# Patient Record
Sex: Female | Born: 1985 | Race: White | Hispanic: No | Marital: Married | Smoking: Never smoker
Health system: Southern US, Community
[De-identification: ages and names within clinical notes are randomized; demographics above are authoritative.]

## PROBLEM LIST (undated history)

## (undated) DIAGNOSIS — T7840XA Allergy, unspecified, initial encounter: Secondary | ICD-10-CM

## (undated) DIAGNOSIS — Z789 Other specified health status: Secondary | ICD-10-CM

## (undated) DIAGNOSIS — U071 COVID-19: Secondary | ICD-10-CM

## (undated) HISTORY — DX: Other specified health status: Z78.9

## (undated) HISTORY — DX: Allergy, unspecified, initial encounter: T78.40XA

## (undated) HISTORY — PX: WISDOM TOOTH EXTRACTION: SHX21

## (undated) HISTORY — DX: COVID-19: U07.1

---

## 2017-06-25 DIAGNOSIS — Z3490 Encounter for supervision of normal pregnancy, unspecified, unspecified trimester: Secondary | ICD-10-CM | POA: Insufficient documentation

## 2017-06-26 LAB — OB RESULTS CONSOLE ABO/RH: RH TYPE: POSITIVE

## 2017-06-26 LAB — OB RESULTS CONSOLE GC/CHLAMYDIA
CHLAMYDIA, DNA PROBE: NEGATIVE
Gonorrhea: NEGATIVE

## 2017-06-26 LAB — OB RESULTS CONSOLE RPR: RPR: NONREACTIVE

## 2017-06-26 LAB — OB RESULTS CONSOLE RUBELLA ANTIBODY, IGM: RUBELLA: IMMUNE

## 2017-06-26 LAB — OB RESULTS CONSOLE HEPATITIS B SURFACE ANTIGEN: HEP B S AG: NEGATIVE

## 2017-06-26 LAB — OB RESULTS CONSOLE ANTIBODY SCREEN: Antibody Screen: NEGATIVE

## 2017-06-26 LAB — OB RESULTS CONSOLE HIV ANTIBODY (ROUTINE TESTING): HIV: NONREACTIVE

## 2017-11-25 NOTE — L&D Delivery Note (Signed)
Delivery Note Pt progressed quickly to complete with uncontrollable urge to push. She pushed in presence of nurse (then midwife then Dr Jackelyn KnifeMeisinger. At 7:50 AM a viable female was delivered via Vaginal, Spontaneous (Presentation:LOA ;  ).  APGAR: 8, 9; weight  pending.   Placenta status:delivered , .  Cord:3vc  with the following complications: nuchal x1 Cord pH: n/a  Anesthesia:  None Episiotomy: None Lacerations: None Est. Blood Loss (mL): 100  Mom to postpartum.  Baby to Couplet care / Skin to Skin  Circ in office.  Cathrine MusterCecilia W Nike Southwell 01/21/2018, 8:22 AM

## 2017-12-29 LAB — OB RESULTS CONSOLE GBS: GBS: NEGATIVE

## 2018-01-14 ENCOUNTER — Encounter (HOSPITAL_COMMUNITY): Payer: Self-pay | Admitting: *Deleted

## 2018-01-14 ENCOUNTER — Telehealth (HOSPITAL_COMMUNITY): Payer: Self-pay | Admitting: *Deleted

## 2018-01-14 NOTE — Telephone Encounter (Signed)
Preadmission screen  

## 2018-01-21 ENCOUNTER — Inpatient Hospital Stay (HOSPITAL_COMMUNITY)
Admission: AD | Admit: 2018-01-21 | Discharge: 2018-01-23 | DRG: 807 | Disposition: A | Discharge status: Home / Self Care | Payer: No Typology Code available for payment source | Source: Ambulatory Visit | Attending: Obstetrics and Gynecology | Admitting: Obstetrics and Gynecology

## 2018-01-21 ENCOUNTER — Encounter (HOSPITAL_COMMUNITY): Payer: Self-pay | Admitting: *Deleted

## 2018-01-21 ENCOUNTER — Inpatient Hospital Stay (HOSPITAL_COMMUNITY): Payer: No Typology Code available for payment source | Admitting: Anesthesiology

## 2018-01-21 ENCOUNTER — Other Ambulatory Visit: Payer: Self-pay

## 2018-01-21 DIAGNOSIS — O4103X0 Oligohydramnios, third trimester, not applicable or unspecified: Principal | ICD-10-CM | POA: Diagnosis present

## 2018-01-21 DIAGNOSIS — Z3A39 39 weeks gestation of pregnancy: Secondary | ICD-10-CM | POA: Diagnosis not present

## 2018-01-21 LAB — CBC
HEMATOCRIT: 36.6 % (ref 36.0–46.0)
Hemoglobin: 13 g/dL (ref 12.0–15.0)
MCH: 33.6 pg (ref 26.0–34.0)
MCHC: 35.5 g/dL (ref 30.0–36.0)
MCV: 94.6 fL (ref 78.0–100.0)
PLATELETS: 226 10*3/uL (ref 150–400)
RBC: 3.87 MIL/uL (ref 3.87–5.11)
RDW: 13.1 % (ref 11.5–15.5)
WBC: 13.1 10*3/uL — ABNORMAL HIGH (ref 4.0–10.5)

## 2018-01-21 LAB — ABO/RH: ABO/RH(D): O POS

## 2018-01-21 LAB — RPR: RPR: NONREACTIVE

## 2018-01-21 LAB — TYPE AND SCREEN
ABO/RH(D): O POS
Antibody Screen: NEGATIVE

## 2018-01-21 MED ORDER — LACTATED RINGERS IV SOLN: 500.0000 mL | INTRAVENOUS | Status: DC | PRN

## 2018-01-21 MED ORDER — LIDOCAINE HCL (PF) 1 % IJ SOLN
30.0000 mL | INTRAMUSCULAR | Status: DC | PRN
  Filled 2018-01-21: qty 30

## 2018-01-21 MED ORDER — IBUPROFEN 100 MG/5ML PO SUSP
600.0000 mg | Freq: Four times a day (QID) | ORAL | Status: DC
  Administered 2018-01-21 – 2018-01-23 (×4): 600 mg via ORAL
  Filled 2018-01-21 (×8): qty 30

## 2018-01-21 MED ORDER — PRENATAL MULTIVITAMIN CH
1.0000 | ORAL_TABLET | Freq: Every day | ORAL | Status: DC
  Administered 2018-01-21: 1 via ORAL
  Filled 2018-01-21: qty 1

## 2018-01-21 MED ORDER — LACTATED RINGERS IV SOLN: 500.0000 mL | Freq: Once | INTRAVENOUS | Status: DC

## 2018-01-21 MED ORDER — IBUPROFEN 600 MG PO TABS
600.0000 mg | ORAL_TABLET | Freq: Four times a day (QID) | ORAL | Status: DC
  Administered 2018-01-21: 600 mg via ORAL
  Filled 2018-01-21: qty 1

## 2018-01-21 MED ORDER — SENNOSIDES-DOCUSATE SODIUM 8.6-50 MG PO TABS
2.0000 | ORAL_TABLET | ORAL | Status: DC
  Administered 2018-01-21 – 2018-01-23 (×2): 2 via ORAL
  Filled 2018-01-21 (×2): qty 2

## 2018-01-21 MED ORDER — ONDANSETRON HCL 4 MG/2ML IJ SOLN: 4.0000 mg | Freq: Four times a day (QID) | INTRAMUSCULAR | Status: DC | PRN

## 2018-01-21 MED ORDER — EPHEDRINE 5 MG/ML INJ
10.0000 mg | INTRAVENOUS | Status: DC | PRN
  Filled 2018-01-21: qty 2

## 2018-01-21 MED ORDER — ZOLPIDEM TARTRATE 5 MG PO TABS: 5.0000 mg | ORAL_TABLET | Freq: Every evening | ORAL | Status: DC | PRN

## 2018-01-21 MED ORDER — DIPHENHYDRAMINE HCL 50 MG/ML IJ SOLN: 12.5000 mg | INTRAMUSCULAR | Status: DC | PRN

## 2018-01-21 MED ORDER — PHENYLEPHRINE 40 MCG/ML (10ML) SYRINGE FOR IV PUSH (FOR BLOOD PRESSURE SUPPORT)
80.0000 ug | PREFILLED_SYRINGE | INTRAVENOUS | Status: DC | PRN
  Filled 2018-01-21: qty 5
  Filled 2018-01-21: qty 10

## 2018-01-21 MED ORDER — BENZOCAINE-MENTHOL 20-0.5 % EX AERO
1.0000 "application " | INHALATION_SPRAY | CUTANEOUS | Status: DC | PRN
  Administered 2018-01-21: 1 via TOPICAL
  Filled 2018-01-21: qty 56

## 2018-01-21 MED ORDER — SOD CITRATE-CITRIC ACID 500-334 MG/5ML PO SOLN: 30.0000 mL | ORAL | Status: DC | PRN

## 2018-01-21 MED ORDER — PHENYLEPHRINE 40 MCG/ML (10ML) SYRINGE FOR IV PUSH (FOR BLOOD PRESSURE SUPPORT)
80.0000 ug | PREFILLED_SYRINGE | INTRAVENOUS | Status: DC | PRN
  Filled 2018-01-21: qty 10
  Filled 2018-01-21: qty 5

## 2018-01-21 MED ORDER — OXYCODONE-ACETAMINOPHEN 5-325 MG PO TABS: 2.0000 | ORAL_TABLET | ORAL | Status: DC | PRN

## 2018-01-21 MED ORDER — COMPLETENATE 29-1 MG PO CHEW
1.0000 | CHEWABLE_TABLET | Freq: Every day | ORAL | Status: DC
  Administered 2018-01-22 – 2018-01-23 (×2): 1 via ORAL
  Filled 2018-01-21 (×2): qty 1

## 2018-01-21 MED ORDER — PSEUDOEPHEDRINE HCL 30 MG PO TABS: 30.0000 mg | ORAL_TABLET | Freq: Four times a day (QID) | ORAL | Status: DC | PRN

## 2018-01-21 MED ORDER — WITCH HAZEL-GLYCERIN EX PADS: 1.0000 "application " | MEDICATED_PAD | CUTANEOUS | Status: DC | PRN

## 2018-01-21 MED ORDER — DIBUCAINE 1 % RE OINT: 1.0000 "application " | TOPICAL_OINTMENT | RECTAL | Status: DC | PRN

## 2018-01-21 MED ORDER — OXYCODONE HCL 5 MG PO TABS: 10.0000 mg | ORAL_TABLET | ORAL | Status: DC | PRN

## 2018-01-21 MED ORDER — OXYCODONE-ACETAMINOPHEN 5-325 MG PO TABS: 1.0000 | ORAL_TABLET | ORAL | Status: DC | PRN

## 2018-01-21 MED ORDER — ACETAMINOPHEN 325 MG PO TABS: 650.0000 mg | ORAL_TABLET | ORAL | Status: DC | PRN

## 2018-01-21 MED ORDER — ACETAMINOPHEN 325 MG PO TABS
650.0000 mg | ORAL_TABLET | ORAL | Status: DC | PRN
  Filled 2018-01-21: qty 2

## 2018-01-21 MED ORDER — OXYCODONE HCL 5 MG PO TABS: 5.0000 mg | ORAL_TABLET | ORAL | Status: DC | PRN

## 2018-01-21 MED ORDER — DIPHENHYDRAMINE HCL 25 MG PO CAPS: 25.0000 mg | ORAL_CAPSULE | Freq: Four times a day (QID) | ORAL | Status: DC | PRN

## 2018-01-21 MED ORDER — BUTORPHANOL TARTRATE 1 MG/ML IJ SOLN: 1.0000 mg | INTRAMUSCULAR | Status: DC | PRN

## 2018-01-21 MED ORDER — LACTATED RINGERS IV SOLN: INTRAVENOUS | Status: DC

## 2018-01-21 MED ORDER — OXYTOCIN BOLUS FROM INFUSION
500.0000 mL | Freq: Once | INTRAVENOUS | Status: AC
  Administered 2018-01-21: 500 mL via INTRAVENOUS

## 2018-01-21 MED ORDER — COCONUT OIL OIL
1.0000 "application " | TOPICAL_OIL | Status: DC | PRN
  Administered 2018-01-23: 1 via TOPICAL
  Filled 2018-01-21: qty 120

## 2018-01-21 MED ORDER — SIMETHICONE 80 MG PO CHEW
80.0000 mg | CHEWABLE_TABLET | ORAL | Status: DC | PRN
Start: 2018-01-21 — End: 2018-01-23

## 2018-01-21 MED ORDER — OXYTOCIN 40 UNITS IN LACTATED RINGERS INFUSION - SIMPLE MED
2.5000 [IU]/h | INTRAVENOUS | Status: DC
  Filled 2018-01-21: qty 1000

## 2018-01-21 MED ORDER — TETANUS-DIPHTH-ACELL PERTUSSIS 5-2.5-18.5 LF-MCG/0.5 IM SUSP: 0.5000 mL | Freq: Once | INTRAMUSCULAR | Status: DC

## 2018-01-21 MED ORDER — ONDANSETRON HCL 4 MG/2ML IJ SOLN: 4.0000 mg | INTRAMUSCULAR | Status: DC | PRN

## 2018-01-21 MED ORDER — ONDANSETRON HCL 4 MG PO TABS: 4.0000 mg | ORAL_TABLET | ORAL | Status: DC | PRN

## 2018-01-21 MED ORDER — FENTANYL 2.5 MCG/ML BUPIVACAINE 1/10 % EPIDURAL INFUSION (WH - ANES)
14.0000 mL/h | INTRAMUSCULAR | Status: DC | PRN
  Filled 2018-01-21: qty 100

## 2018-01-21 MED ORDER — FLEET ENEMA 7-19 GM/118ML RE ENEM: 1.0000 | ENEMA | Freq: Every day | RECTAL | Status: DC | PRN

## 2018-01-21 NOTE — Lactation Note (Signed)
This note was copied from a baby's chart. Lactation Consultation Note  Patient Name: Jeanette Rice ZOXWR'UToday's Date: 01/21/2018 Reason for consult: Initial assessment;Term This is second baby and newborn is 5 hours old.  Mom had difficult latch with first and used a nipple shield.  Baby had excessive weight loss the first week and needed formula supplementation.  Newborn has not fed yet.  He has started to show feeding cues.  Assisted with positioning baby skin to skin in cross cradle hold on right breast. Hand expression done with a glisten of colostrum noted.  After a few minutes baby latched well with weak sucks for 7 minutes.  Waking techniques and breast massage done.  Baby pulled off and would not relatch.  Attempted opposite breast but he was showing no interest.  Mom feeling strong uterine cramping during feeding.  Explained why and that this is also a sign of milk letdown.  Instructed to watch for feeding cues and call for assist/concerns prn.  Maternal Data Has patient been taught Hand Expression?: Yes Does the patient have breastfeeding experience prior to this delivery?: Yes  Feeding Feeding Type: Breast Fed Length of feed: 7 min  LATCH Score Latch: Repeated attempts needed to sustain latch, nipple held in mouth throughout feeding, stimulation needed to elicit sucking reflex.  Audible Swallowing: A few with stimulation  Type of Nipple: Everted at rest and after stimulation  Comfort (Breast/Nipple): Soft / non-tender  Hold (Positioning): Assistance needed to correctly position infant at breast and maintain latch.  LATCH Score: 7  Interventions Interventions: Breast feeding basics reviewed;Assisted with latch;Skin to skin;Breast massage;Hand express;Breast compression;Adjust position;Support pillows;Position options  Lactation Tools Discussed/Used     Consult Status Consult Status: Follow-up Date: 01/22/18 Follow-up type: In-patient    Huston FoleyMOULDEN, Abdou Stocks S 01/21/2018,  12:55 PM

## 2018-01-21 NOTE — Anesthesia Pain Management Evaluation Note (Signed)
  CRNA Pain Management Visit Note  Patient: Jeanette Rice, 32 y.o., female  "Hello I am a member of the anesthesia team at [facility]. We have an anesthesia team available at all times to provide care throughout the hospital, including epidural management and anesthesia for C-section. I don't know your plan for the delivery whether it a natural birth, water birth, IV sedation, nitrous supplementation, doula or epidural, but we want to meet your pain goals."   1.Was your pain managed to your expectations on prior hospitalizations?   No prior hospitalizations  2.What is your expectation for pain management during this hospitalization?     Epidural  3.How can we help you reach that goal?   Record the patient's initial score and the patient's pain goal.   Pain: 10  Pain Goal: 3 [facility] wants you to be able to say your pain was always managed very well.  Jeanette Rice,Davidson Palmieri Hristova 01/21/2018

## 2018-01-21 NOTE — Anesthesia Preprocedure Evaluation (Deleted)
Anesthesia Evaluation    Airway        Dental   Pulmonary           Cardiovascular      Neuro/Psych    GI/Hepatic   Endo/Other    Renal/GU      Musculoskeletal   Abdominal   Peds  Hematology   Anesthesia Other Findings   Reproductive/Obstetrics                            Lab Results  Component Value Date   WBC 13.1 (H) 01/21/2018   HGB 13.0 01/21/2018   HCT 36.6 01/21/2018   MCV 94.6 01/21/2018   PLT 226 01/21/2018    Anesthesia Physical Anesthesia Plan  ASA:   Anesthesia Plan:    Post-op Pain Management:    Induction:   PONV Risk Score and Plan:   Airway Management Planned:   Additional Equipment:   Intra-op Plan:   Post-operative Plan:   Informed Consent:   Plan Discussed with:   Anesthesia Plan Comments:        Anesthesia Quick Evaluation

## 2018-01-21 NOTE — H&P (Signed)
Jeanette Rice is a 32 y.o. female presenting for painful regular contractions. Pt arrived at [facility] and was 3cm and progressed to complete within two hours of arrival. . Nl panorama screen. Her prenatal course was benign till this past week when had ?oligohydramnios.  OB History    Gravida Para Term Preterm AB Living   2 1 1     1    SAB TAB Ectopic Multiple Live Births           1     Past Medical History:  Diagnosis Date  . Medical history non-contributory    Past Surgical History:  Procedure Laterality Date  . WISDOM TOOTH EXTRACTION     Family History: family history is not on file. Social History:  reports that  has never smoked. she has never used smokeless tobacco. She reports that she does not drink alcohol or use drugs.     Maternal Diabetes: No Genetic Screening: Normal Maternal Ultrasounds/Referrals: Normal ( ? [place]) Fetal Ultrasounds or other Referrals:  None Maternal Substance Abuse:  No Significant Maternal Medications:  None Significant Maternal Lab Results:  None Other Comments:  None  Review of Systems  Constitutional: Negative for chills, fever, malaise/fatigue and weight loss.  Eyes: Negative for blurred vision.  Respiratory: Negative for shortness of breath.   Cardiovascular: Negative for chest pain.  Gastrointestinal: Positive for abdominal pain. Negative for heartburn, nausea and vomiting.  Genitourinary: Negative for dysuria.  Musculoskeletal: Negative for back pain.  Skin: Negative for itching and rash.  Neurological: Negative for dizziness and headaches.  Endo/Heme/Allergies: Does not bruise/bleed easily.  Psychiatric/Behavioral: Negative for depression, hallucinations, substance abuse and suicidal ideas. The patient is not nervous/anxious.    Maternal Medical History:  Reason for admission: Contractions.  Nausea.  Contractions: Onset was 6-12 hours ago.   Frequency: regular.   Perceived severity is strong.    Fetal activity: Perceived fetal  activity is normal.   Last perceived fetal movement was within the past hour.    Prenatal complications: Oligohydramnios.   Prenatal Complications - Diabetes: none.    Dilation: 5.5 Effacement (%): 90 Station: -1 Exam by:: Gifford ShaveYancey Luft RN  Blood pressure 117/71, pulse 85, temperature 98 F (36.7 C), resp. rate 18, height 5\' 5"  (1.651 m), weight 162 lb (73.5 kg), last menstrual period 04/20/2017, SpO2 100 %. Maternal Exam:  Uterine Assessment: Contraction strength is firm.  Contraction frequency is rare.   Abdomen: Patient reports generalized tenderness.  Pelvis: adequate for delivery.   Cervix: Cervix evaluated by digital exam.     Physical Exam  Constitutional: She appears well-developed and well-nourished.  Neck: Normal range of motion.  Cardiovascular: Normal rate.  GI: Soft. There is generalized tenderness.  Genitourinary: Vagina normal and uterus normal.  Skin: Skin is warm and dry.  Psychiatric: She has a normal mood and affect. Her behavior is normal. Judgment and thought content normal.    Prenatal labs: ABO, Rh: --/--/O POS (02/27 [id number]) Antibody: PENDING (02/27 0556) Rubella: Immune (08/02 0000) RPR: Nonreactive (08/02 0000)  HBsAg: Negative (08/02 0000)  HIV: Non-reactive (08/02 0000)  GBS: Negative (02/04 0000)   Assessment/Plan: Prime in labor  Admit    Jeanette Rice 01/21/2018, 8:15 AM

## 2018-01-21 NOTE — [facility]U Note (Signed)
Cntrx 5 mins since 0200. Denies LOF or bleeding. +FM.

## 2018-01-22 ENCOUNTER — Inpatient Hospital Stay (HOSPITAL_COMMUNITY): Payer: 59

## 2018-01-22 LAB — CBC
HCT: 38 % (ref 36.0–46.0)
Hemoglobin: 12.9 g/dL (ref 12.0–15.0)
MCH: 32.7 pg (ref 26.0–34.0)
MCHC: 33.9 g/dL (ref 30.0–36.0)
MCV: 96.4 fL (ref 78.0–100.0)
PLATELETS: 209 10*3/uL (ref 150–400)
RBC: 3.94 MIL/uL (ref 3.87–5.11)
RDW: 13.3 % (ref 11.5–15.5)
WBC: 11.1 10*3/uL — AB (ref 4.0–10.5)

## 2018-01-22 MED ORDER — GUAIFENESIN 100 MG/5ML PO SOLN
5.0000 mL | ORAL | Status: DC | PRN
  Administered 2018-01-22: 100 mg via ORAL
  Filled 2018-01-22: qty 15

## 2018-01-22 NOTE — Progress Notes (Signed)
CSW received consult for score of 9 on Edinburgh Postnatal Depression Scale.  A score of 10 or higher and or a positive response to question 10 is an automatic CSW consult.  Therefore, CSW is screening out referral at this time.  Please contact CSW by MOB's request or if current concerns arise.

## 2018-01-22 NOTE — Progress Notes (Addendum)
Post Partum Day 1 Subjective: up ad lib and tolerating PO  C/o worsening cough, no fever.  Slightly productive.  Objective: Blood pressure 106/61, pulse 77, temperature 97.7 F (36.5 C), temperature source Oral, resp. rate 18, height 5\' 5"  (1.651 m), weight 162 lb (73.5 kg), last menstrual period 04/20/2017, SpO2 100 %, unknown if currently breastfeeding.  Physical Exam:  General: alert and cooperative Lochia: appropriate Uterine Fundus: firm Lungs CTA bilaterally  Recent Labs    01/21/18 0601 01/22/18 0512  HGB 13.0 12.9  HCT 36.6 38.0    Assessment/Plan: Plan for discharge tomorrow  Plans circumcision at home with ceremony Lungs clear, will follow symptoms and treat  cough   LOS: 1 day   Oliver PilaKathy W Geovanni Rahming 01/22/2018, 10:32 AM

## 2018-01-23 MED ORDER — IBUPROFEN 100 MG/5ML PO SUSP: 600.0000 mg | Freq: Four times a day (QID) | ORAL | 1 refills | Status: DC | PRN

## 2018-01-23 NOTE — Lactation Note (Addendum)
This note was copied from a baby's chart. Lactation Consultation Note  Patient Name: Jeanette Virgie DadSarah Bais VHQIO'NToday's Date: 01/23/2018    Mother is wearing comfort gels and states nipples feel better. Discussed supply and demand.  Mother inquiring about sleeping during the night and only breastfeeding during the day.   Mom encouraged to feed baby 8-12 times/24 hours and with feeding cues.  Reviewed engorgement care and monitoring voids/stools. Pacifier use not recommended at this time.   Observed feeding. Mother complaining of painful latch. No cracks or abrasions.  Nipples round when unlatched. Provided comfort gels and shells. Reviewed engorgement care and monitoring voids/stools.     Maternal Data    Feeding Feeding Type: Breast Fed Length of feed: 10 min  LATCH Score                   Interventions    Lactation Tools Discussed/Used     Consult Status      Hardie PulleyBerkelhammer, Ruth Boschen 01/23/2018, 9:41 AM

## 2018-01-23 NOTE — Discharge Summary (Signed)
    OB Discharge Summary     Patient Name: Jeanette Rice DOB: [birth year] MRN: [id number]  Date of admission: 01/21/2018 Delivering MD: Shawna ClampBOOKER, KIMBERLY R   Date of discharge: 01/23/2018  Admitting diagnosis: 39 WEEKS CTX Intrauterine pregnancy: 7664w3d     Secondary diagnosis:  Active Problems:   Normal labor   Postpartum care following vaginal delivery      Discharge diagnosis: Term Pregnancy Delivered                                  Hospital course:  Onset of Labor With Vaginal Delivery     [age] y.o. yo [id number] at 1064w3d was admitted in Active Labor on 01/21/2018. Patient had an uncomplicated labor course as follows:  Membrane Rupture Time/Date: 7:12 AM ,01/21/2018   Intrapartum Procedures: Episiotomy: None [1]                                         Lacerations:  None [1]  She had rapid labor after SROM and had a precipitous delivery of a Viable infant. 01/21/2018  Information for the patient's newborn:  Corrie DandyGlazer, Boy Aisha [[id number]][[id number]]  Delivery Method: Vaginal, Spontaneous(Filed from Delivery Summary)    Pateint had an uncomplicated postpartum course.  She is ambulating, tolerating a regular diet, passing flatus, and urinating well. Patient is discharged home in stable condition on 01/23/18.   Physical exam  Vitals:   01/21/18 1430 01/22/18 0500 01/22/18 1815 01/23/18 0500  BP: 114/64 106/61 116/64 113/66  Pulse: 72 77 90 71  Resp: 20 18 17    Temp: 97.9 F (36.6 C) 97.7 F (36.5 C) 98.3 F (36.8 C) 98.2 F (36.8 C)  TempSrc: Oral Oral Oral Oral  SpO2:      Weight:      Height:       General: alert Lochia: appropriate Uterine Fundus: firm  Labs: Lab Results  Component Value Date   WBC 11.1 (H) 01/22/2018   HGB 12.9 01/22/2018   HCT 38.0 01/22/2018   MCV 96.4 01/22/2018   PLT 209 01/22/2018   No flowsheet data found.  Discharge instruction: per After Visit Summary and "Baby and Me Booklet".  After visit meds:  Allergies as of 01/23/2018   No Known Allergies     Medication List    TAKE these medications   ibuprofen 100 MG/5ML suspension Commonly known as:  ADVIL,MOTRIN Take 30 mLs (600 mg total) by mouth every 6 (six) hours as needed for moderate pain.       Diet: routine diet  Activity: Advance as tolerated. Pelvic rest for 6 weeks.   Outpatient follow up:6 weeks   Newborn Data: Live born female  Birth Weight: 6 lb 5.4 oz (2875 g) APGAR: 8, 9  Newborn Delivery   Birth date/time:  01/21/2018 07:50:00 Delivery type:  Vaginal, Spontaneous     Baby Feeding: Breast Disposition:home with mother   01/23/2018 Zenaida Nieceodd D Mehtaab Mayeda, MD

## 2018-01-23 NOTE — Lactation Note (Signed)
This note was copied from a baby's chart. Lactation Consultation Note  Patient Name: Jeanette Rice Reason for consult: Follow-up assessment  Mom's milk is coming to volume. Infant observed at breast; frequent swallows noted. Nipples atraumatic, but Mom has considerable discomfort on R side. When infant latched to R side, I assisted in lowering infant's jaw, but Mom didn't feel much improvement. Mom was also placed in a laid-back position, but infant soon fell asleep.   Comfort Gels provided w/instructions for use. Mom has her old Medela DEBP at home; she has also ordered a Spectra which should arrive on Monday.   Note: Mom's 1st child had issues with jaundice, poor feeding & weight loss during the 1st week of life. I do not anticipate the same situation with this infant. [name], [name] [name], 12:03 AM

## 2018-01-23 NOTE — Discharge Instructions (Signed)
As per discharge pamphlet °

## 2018-01-23 NOTE — Progress Notes (Signed)
PPD #2 Doing well Afeb, VSS D/c home 

## 2018-01-28 ENCOUNTER — Encounter (HOSPITAL_COMMUNITY): Payer: 59

## 2019-06-28 ENCOUNTER — Other Ambulatory Visit: Payer: Self-pay

## 2019-06-28 DIAGNOSIS — Z20822 Contact with and (suspected) exposure to covid-19: Secondary | ICD-10-CM

## 2019-06-29 LAB — NOVEL CORONAVIRUS, NAA: SARS-CoV-2, NAA: NOT DETECTED

## 2019-08-04 ENCOUNTER — Other Ambulatory Visit: Payer: Self-pay

## 2019-08-04 DIAGNOSIS — Z20822 Contact with and (suspected) exposure to covid-19: Secondary | ICD-10-CM

## 2019-08-05 LAB — NOVEL CORONAVIRUS, NAA: SARS-CoV-2, NAA: NOT DETECTED

## 2019-08-26 ENCOUNTER — Other Ambulatory Visit: Payer: Self-pay | Admitting: Obstetrics and Gynecology

## 2019-08-30 ENCOUNTER — Other Ambulatory Visit: Payer: Self-pay | Admitting: *Deleted

## 2019-08-30 DIAGNOSIS — Z1231 Encounter for screening mammogram for malignant neoplasm of breast: Secondary | ICD-10-CM

## 2019-09-22 ENCOUNTER — Other Ambulatory Visit: Payer: Self-pay | Admitting: Obstetrics and Gynecology

## 2019-09-22 DIAGNOSIS — R922 Inconclusive mammogram: Secondary | ICD-10-CM

## 2019-09-22 DIAGNOSIS — Z803 Family history of malignant neoplasm of breast: Secondary | ICD-10-CM

## 2019-10-12 ENCOUNTER — Other Ambulatory Visit: Payer: Self-pay

## 2019-10-12 ENCOUNTER — Ambulatory Visit
Admission: RE | Admit: 2019-10-12 | Discharge: 2019-10-12 | Disposition: A | Discharge status: Home / Self Care | Payer: Self-pay | Source: Ambulatory Visit | Attending: Obstetrics and Gynecology | Admitting: Obstetrics and Gynecology

## 2019-10-12 DIAGNOSIS — R922 Inconclusive mammogram: Secondary | ICD-10-CM

## 2019-10-12 DIAGNOSIS — Z803 Family history of malignant neoplasm of breast: Secondary | ICD-10-CM

## 2019-10-12 IMAGING — MR MR BREAST WO/W CM  BILAT
5 series · 31 of 48 positions shown · IV contrast (gadavist)
Comparison: Previous exam(s).

CLINICAL DATA: Abbreviated Breast MRI for breast cancer screening.

LABS:  None performed today and site.
EXAM:
BILATERAL ABBREVIATED BREAST MRI WITH AND WITHOUT CONTRAST
TECHNIQUE: Multiplanar, multisequence MR images of both breasts were obtained
prior to and following the intravenous administration of 5 ml of
Gadavist.
Three-dimensional MR images were rendered by post-processing of the
original MR data on an independent workstation. The
three-dimensional MR images were interpreted, and findings are
reported in the following complete MRI report for this study.

[Series 2: t2_tirm_tra ipat (a-p) · axial · 3.0mm · 0.64mm/px · z∈[-78,+84]mm · 5 of 55 slices shown]
[im 1/55]
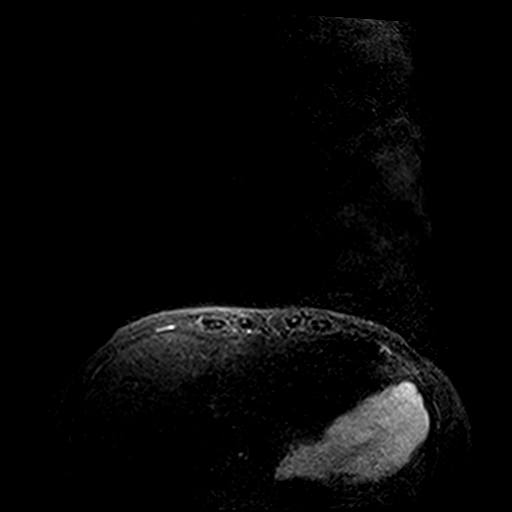
[im 14/55]
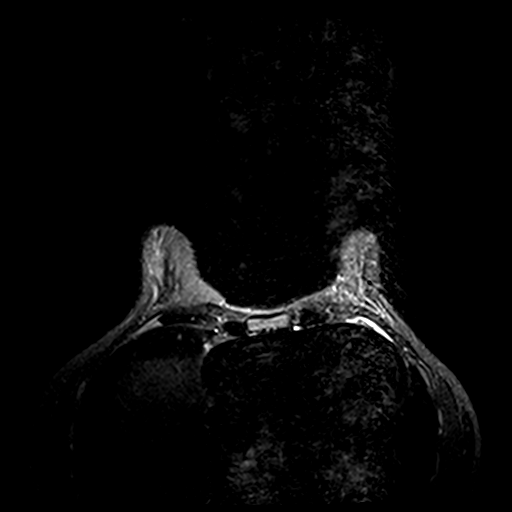
[im 28/55]
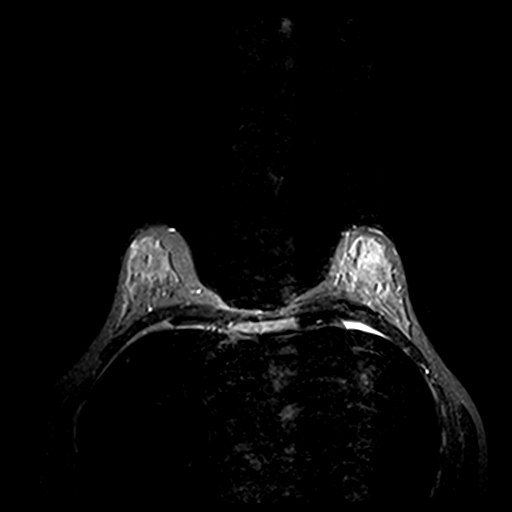
[im 41/55]
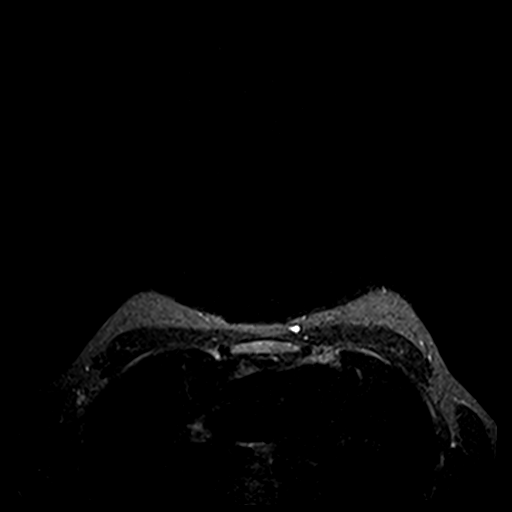
[im 55/55]
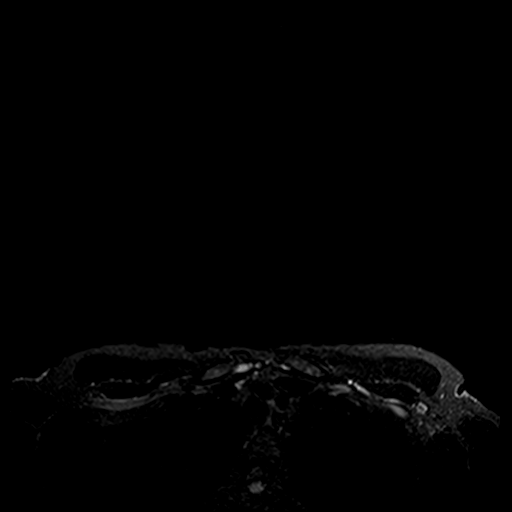

[Series 3: fl3d pre-cm · axial · non-contrast · 1.2mm · 0.86mm/px · z∈[-83,+88]mm · 9 of 144 slices shown]
[im 1/144]
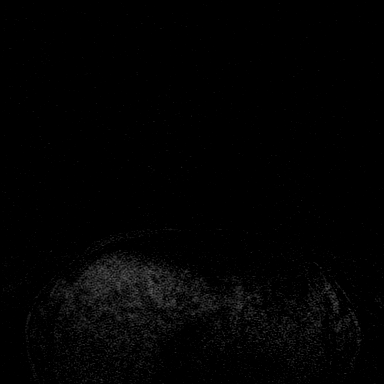
[im 12/144]
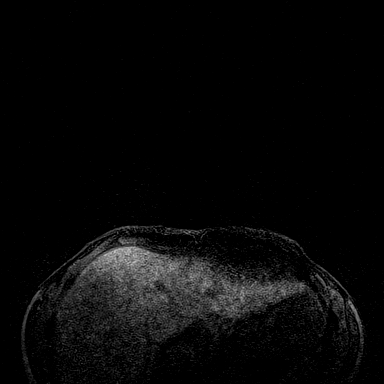
[im 23/144]
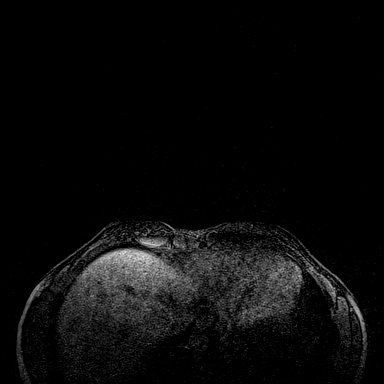
[im 45/144]
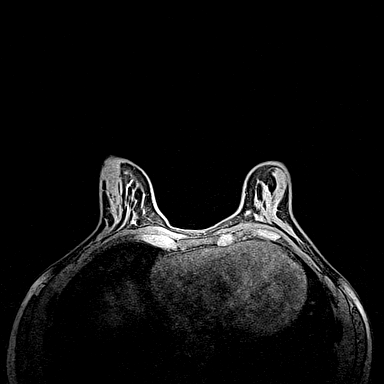
[im 67/144]
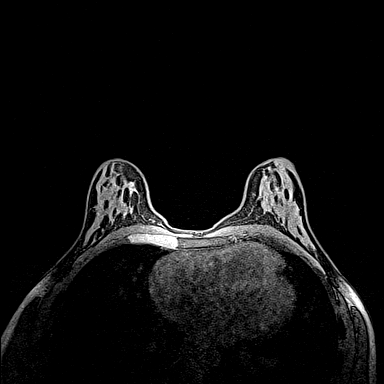
[im 78/144]
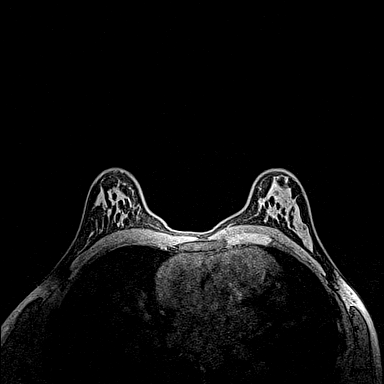
[im 100/144]
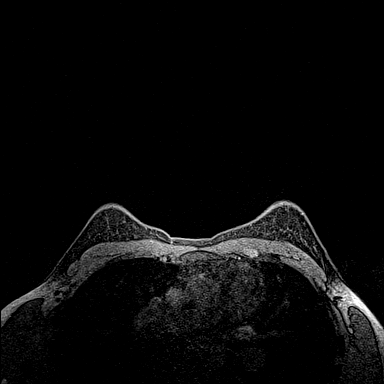
[im 122/144]
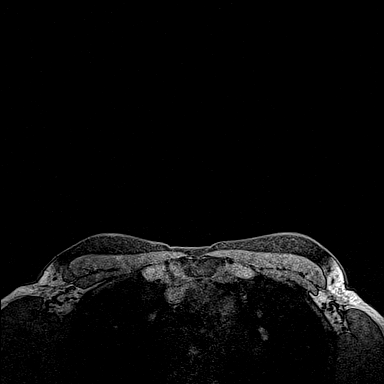
[im 144/144]
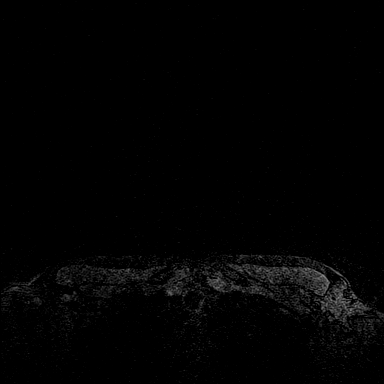

[Series 4: fl3d post-cm 20 · axial · 1.2mm · 0.86mm/px · z∈[-83,+88]mm · 8 of 144 slices shown (1 of 3)]
[im 1/144]
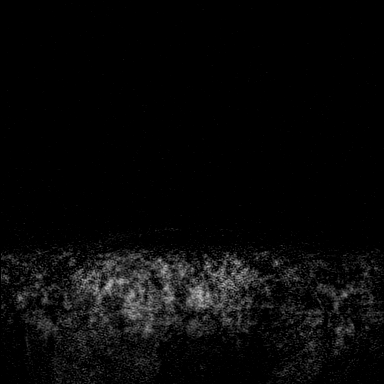
[im 23/144]
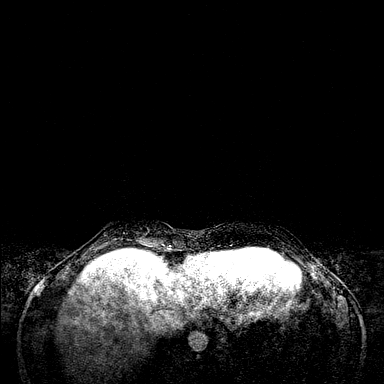
[im 45/144]
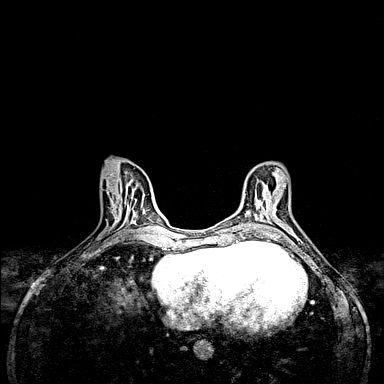
[im 67/144]
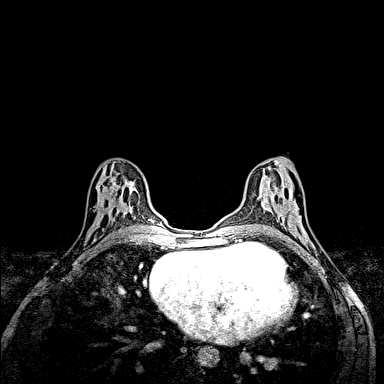
[im 78/144]
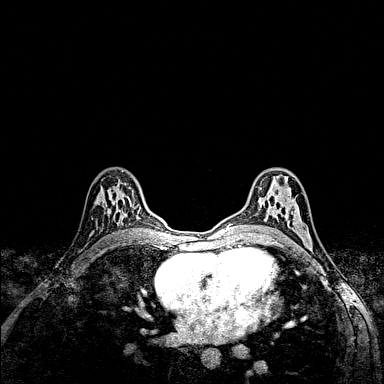
[im 100/144]
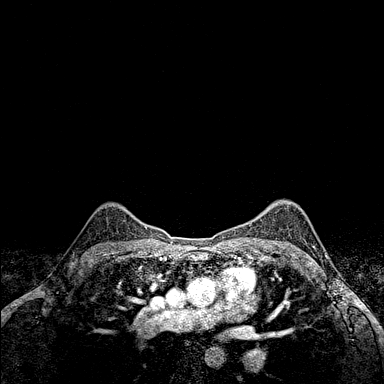
[im 122/144]
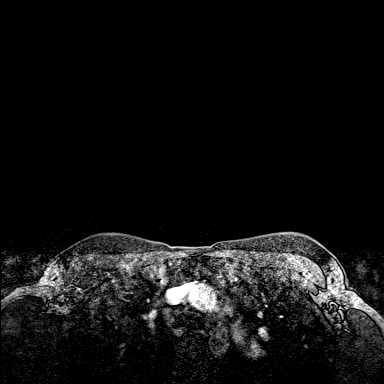
[im 144/144]
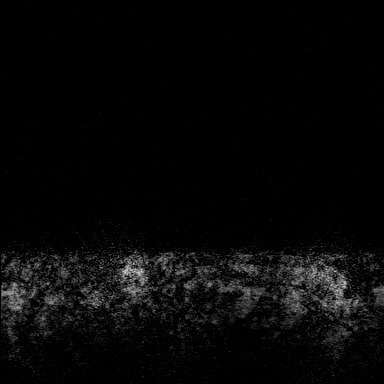

[Series 5: fl3d post-cm 20 · axial · 1.2mm · 0.86mm/px · z∈[-83,+88]mm · 8 of 144 slices shown (2 of 3)]
[im 1/144]
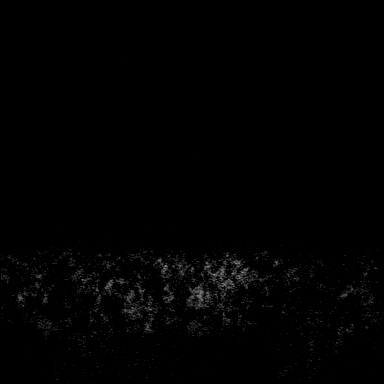
[im 23/144]
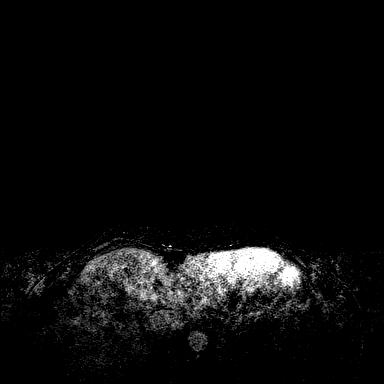
[im 45/144]
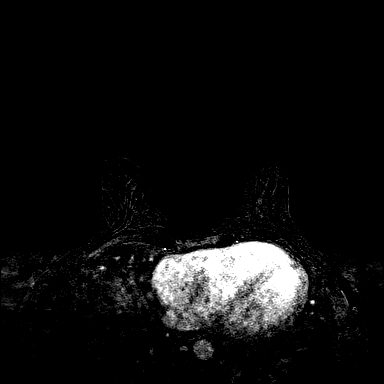
[im 67/144]
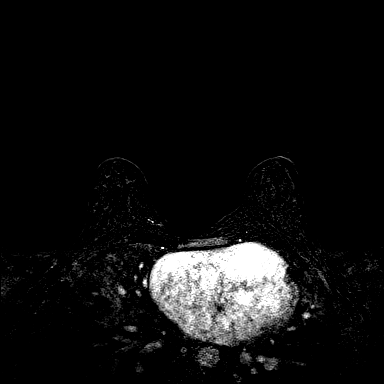
[im 78/144]
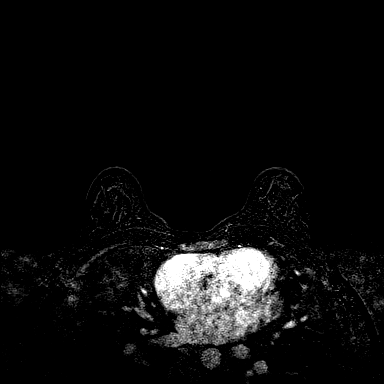
[im 100/144]
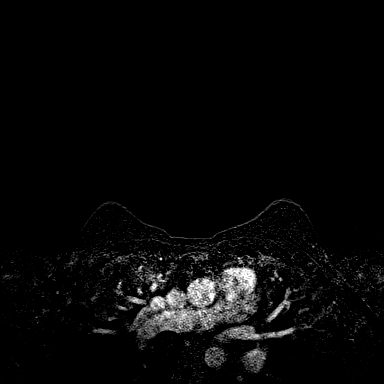
[im 122/144]
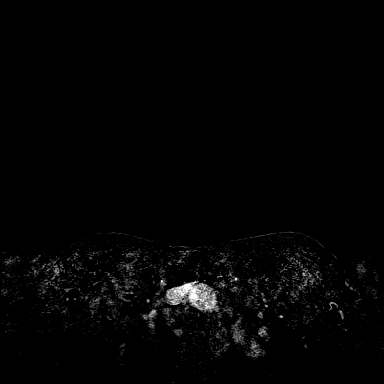
[im 144/144]
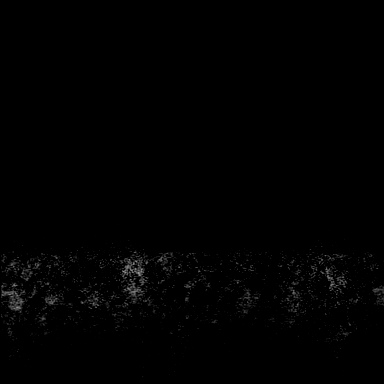

[Series 6: fl3d post-cm 20 · axial · 172.8mm · 0.86mm/px · 1 of 1 slices shown (3 of 3)]
[im 1/1]
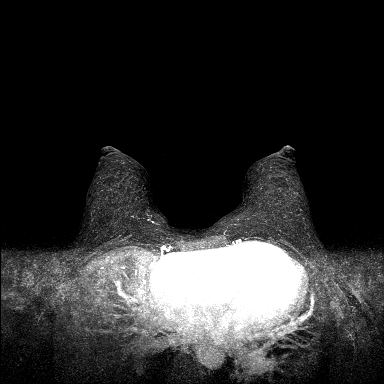

[31 of 48 positions shown; findings below may reference images not displayed]

FINDINGS: Breast composition: c. Heterogeneous fibroglandular tissue.

Background parenchymal enhancement: Minimal.

Right breast: No mass or abnormal enhancement.

Left breast: No mass or abnormal enhancement.

Lymph nodes: No abnormal appearing lymph nodes.

Ancillary findings:  None.
IMPRESSION: No MRI evidence of malignancy.

RECOMMENDATION:
Annual screening mammography beginning at age 40.

BI-RADS CATEGORY  1: Negative.

## 2019-10-12 MED ORDER — GADOBUTROL 1 MMOL/ML IV SOLN
5.0000 mL | Freq: Once | INTRAVENOUS | Status: AC | PRN
  Administered 2019-10-12: 5 mL via INTRAVENOUS

## 2019-11-29 ENCOUNTER — Ambulatory Visit: Payer: No Typology Code available for payment source | Attending: Internal Medicine

## 2019-11-29 DIAGNOSIS — Z20822 Contact with and (suspected) exposure to covid-19: Secondary | ICD-10-CM | POA: Insufficient documentation

## 2019-11-30 LAB — NOVEL CORONAVIRUS, NAA: SARS-CoV-2, NAA: NOT DETECTED

## 2019-12-14 ENCOUNTER — Encounter: Payer: Self-pay | Admitting: Neurology

## 2019-12-14 ENCOUNTER — Ambulatory Visit (INDEPENDENT_AMBULATORY_CARE_PROVIDER_SITE_OTHER): Payer: 59 | Admitting: Neurology

## 2019-12-14 ENCOUNTER — Other Ambulatory Visit: Payer: Self-pay

## 2019-12-14 DIAGNOSIS — R202 Paresthesia of skin: Secondary | ICD-10-CM | POA: Diagnosis not present

## 2019-12-14 NOTE — Progress Notes (Signed)
Reason for visit: Fatigue, memory disturbance  Referring physician: Dr. [name] is a 34 y.o. female  History of present illness:  Jeanette Rice is a 34 year old left-handed white female with a history of a Covid virus infection in the summer 2020.  The patient has had residual symptoms from this.  With the virus, she had severe dizziness and fatigue and may have had a syncopal episode.  The patient has had sensations of heaviness in the arms and legs.  She developed some problems with tingling in the back that has gradually improved and is no longer a problem.  She may have some unusual sensations that come and go below the knees bilaterally.  She reports no definite weakness of the extremities.  Over the last several weeks she has had some recurrence of dizziness.  She continues to have fatigue, she takes naps during the day.  She indicates that even before the Covid infection that she did take naps as well.  She generally goes to bed around 11:30 PM and wakes up around 6 AM.  She has 2 small children.  The patient denies any balance problems or difficulty controlling the bowels or the bladder.  She is concerned about her ongoing symptoms.  Past Medical History:  Diagnosis Date  . COVID-13 June 2019    Past Surgical History:  Procedure Laterality Date  . WISDOM TOOTH EXTRACTION      Family History  Problem Relation Age of Onset  . Healthy Mother   . Healthy Father     Social history:  reports that she has never smoked. She has never used smokeless tobacco. She reports that she does not drink alcohol or use drugs.  Medications:  Prior to Admission medications   Not on File     No Known Allergies  ROS:  Out of a complete 14 system review of symptoms, the patient complains only of the following symptoms, and all other reviewed systems are negative.  Fatigue Concentration difficulty  Blood pressure 110/72, pulse 75, temperature 98.5 F (36.9 C), height 5'  5" (1.651 m), weight 136 lb 8 oz (61.9 kg), unknown if currently breastfeeding.  Physical Exam  General: The patient is alert and cooperative at the time of the examination.  Eyes: Pupils are equal, round, and reactive to light. Discs are flat bilaterally.  Neck: The neck is supple, no carotid bruits are noted.  Respiratory: The respiratory examination is clear.  Cardiovascular: The cardiovascular examination reveals a regular rate and rhythm, no obvious murmurs or rubs are noted.  Skin: Extremities are without significant edema.  Neurologic Exam  Mental status: The patient is alert and oriented x 3 at the time of the examination. The patient has apparent normal recent and remote memory, with an apparently normal attention span and concentration ability.  Mini-Mental status examination done today shows a total score of 30/30.  The patient is able to name 12 four-legged animals in 60 seconds.  Cranial nerves: Facial symmetry is present. There is good sensation of the face to pinprick and soft touch bilaterally. The strength of the facial muscles and the muscles to head turning and shoulder shrug are normal bilaterally. Speech is well enunciated, no aphasia or dysarthria is noted. Extraocular movements are full. Visual fields are full. The tongue is midline, and the patient has symmetric elevation of the soft palate. No obvious hearing deficits are noted.  Motor: The motor testing reveals 5 over 5 strength of  all 4 extremities. Good symmetric motor tone is noted throughout.  Sensory: Sensory testing is intact to pinprick, soft touch, vibration sensation, and position sense on all 4 extremities. No evidence of extinction is noted.  Coordination: Cerebellar testing reveals good finger-nose-finger and heel-to-shin bilaterally.  Gait and station: Gait is normal. Tandem gait is normal. Romberg is negative. No drift is seen.  Reflexes: Deep tendon reflexes are symmetric and normal bilaterally.  Toes are downgoing bilaterally.   Assessment/Plan:  1.  Post Covid syndrome  2.  Sensory alteration  The patient has chronic fatigue and difficulty with focusing following the Covid infection, this is well reported.  Hopefully this will gradually improve over time.  It is possible that in the future use of Adderall or Ritalin may be of some benefit.  The patient is to allow more time for sleep, she should go to bed a least an hour earlier than she is.  She may use melatonin 5 mg at night.  Given the sensory alterations I suggested MRI of the brain to exclude activation of a demyelinating disease such as MS given her age and sex.  The patient is concerned about the cost of the study as she has a very high deductible for her health insurance.  She will contact me if she does wish to undergo MRI evaluation.  She will follow-up if needed, she will contact me if she has any concerns.  Jill Alexanders MD 12/14/2019 11:36 AM  [facility] [address] [place], [place] [address]  Phone [phone] Fax [phone]

## 2020-02-04 ENCOUNTER — Ambulatory Visit: Payer: No Typology Code available for payment source | Attending: Internal Medicine

## 2020-04-13 ENCOUNTER — Ambulatory Visit: Payer: No Typology Code available for payment source

## 2020-05-03 ENCOUNTER — Ambulatory Visit (INDEPENDENT_AMBULATORY_CARE_PROVIDER_SITE_OTHER): Payer: 59 | Admitting: Nurse Practitioner

## 2020-05-03 ENCOUNTER — Other Ambulatory Visit: Payer: Self-pay

## 2020-05-03 VITALS — HR 88 | Temp 97.8°F

## 2020-05-03 DIAGNOSIS — M791 Myalgia, unspecified site: Secondary | ICD-10-CM | POA: Diagnosis not present

## 2020-05-03 DIAGNOSIS — R5381 Other malaise: Secondary | ICD-10-CM | POA: Insufficient documentation

## 2020-05-03 DIAGNOSIS — M79604 Pain in right leg: Secondary | ICD-10-CM | POA: Diagnosis not present

## 2020-05-03 DIAGNOSIS — R5383 Other fatigue: Secondary | ICD-10-CM | POA: Diagnosis not present

## 2020-05-03 DIAGNOSIS — M25571 Pain in right ankle and joints of right foot: Secondary | ICD-10-CM | POA: Insufficient documentation

## 2020-05-03 DIAGNOSIS — Z8616 Personal history of COVID-19: Secondary | ICD-10-CM | POA: Insufficient documentation

## 2020-05-03 DIAGNOSIS — M79605 Pain in left leg: Secondary | ICD-10-CM

## 2020-05-03 NOTE — Progress Notes (Signed)
@Patient  ID: , female    DOB: 1986/08/15, 34 y.o.   MRN: [id number]  Chief Complaint  Patient presents with  . Fatigue    history of covid, muscle pain    Referring provider: [id number], MD   34 year old female with no significant health history. Diagnosed with Covid in July 2020.   HPI  Patient presents today for post Covid care clinic visit.  Patient states that she does do positive for Covid in July 2020.  She has had both vaccines.  She states that since she had Covid she has been having ongoing symptoms of fatigue, muscle pain especially in the lower extremities.  She did have lab work in September which was normal.  She did see neurology in January and was ordered a MRI but at the time she could not afford it due to financial concerns.  She did not have the MRI completed.  She states that times she has severe muscle pain in her legs and has a crawling sensation.  Patient admits that she has not been active since diagnosed with Covid.  She does not work out.  She has been taking some vitamin D supplements. Denies f/c/s, n/v/d, hemoptysis, PND, chest pain or edema.    Note: Patient was walked in office today and O2 sats remained above 98% for the entire walk and heart rate was stable.      No Known Allergies  There is no immunization history for the selected administration types on file for this patient.  Past Medical History:  Diagnosis Date  . COVID-13 June 2019    Tobacco History: Social History   Tobacco Use  Smoking Status Never Smoker  Smokeless Tobacco Never Used   Counseling given: Not Answered   No outpatient encounter medications on file as of 05/03/2020.   No facility-administered encounter medications on file as of 05/03/2020.     Review of Systems  Review of Systems  Constitutional: Negative.  Negative for chills and fever.  HENT: Negative.   Respiratory: Negative for cough and shortness of breath.   Cardiovascular:  Negative.  Negative for chest pain, palpitations and leg swelling.  Gastrointestinal: Negative.   Allergic/Immunologic: Negative.   Neurological: Positive for weakness.  Psychiatric/Behavioral: Negative.        Physical Exam  Pulse 88   Temp 97.8 F (36.6 C)   SpO2 99% Comment: RA  Wt Readings from Last 5 Encounters:  12/14/19 136 lb 8 oz (61.9 kg)  01/21/18 162 lb (73.5 kg)     Physical Exam Vitals and nursing note reviewed.  Constitutional:      General: She is not in acute distress.    Appearance: She is well-developed.  Cardiovascular:     Rate and Rhythm: Normal rate and regular rhythm.  Pulmonary:     Effort: Pulmonary effort is normal.     Breath sounds: Normal breath sounds.  Musculoskeletal:     Right lower leg: No edema.     Left lower leg: No edema.  Neurological:     Mental Status: She is alert and oriented to person, place, and time.     Comments: Weakness noted to bilateral lower extremities  Psychiatric:        Mood and Affect: Mood normal.        Behavior: Behavior normal.        Assessment & Plan:   History of COVID-19 Muscle pain lower extremities Fatigue:  Will check  labs and call with results  Stay active  Stay well hydrated  Will refer to neurorehabilitation center   Follow up:  Follow up in 1 month after starting therapy      Fenton Foy, NP 05/03/2020

## 2020-05-03 NOTE — Patient Instructions (Addendum)
History of covid Muscle pain lower extremities Fatigue:  Will check labs and call with results  Stay active  Stay well hydrated  Will refer to neurorehabilitation center   Follow up:  Follow up in 1 month after starting therapy

## 2020-05-03 NOTE — Assessment & Plan Note (Signed)
Muscle pain lower extremities Fatigue:  Will check labs and call with results  Stay active  Stay well hydrated  Will refer to neurorehabilitation center   Follow up:  Follow up in 1 month after starting therapy

## 2020-05-04 ENCOUNTER — Other Ambulatory Visit: Payer: Self-pay | Admitting: Nurse Practitioner

## 2020-05-04 DIAGNOSIS — M791 Myalgia, unspecified site: Secondary | ICD-10-CM

## 2020-05-04 DIAGNOSIS — Z8616 Personal history of COVID-19: Secondary | ICD-10-CM

## 2020-05-04 DIAGNOSIS — R5383 Other fatigue: Secondary | ICD-10-CM

## 2020-05-06 LAB — CBC WITH DIFFERENTIAL/PLATELET
Basophils Absolute: 0 10*3/uL (ref 0.0–0.2)
Basos: 1 %
EOS (ABSOLUTE): 0.1 10*3/uL (ref 0.0–0.4)
Eos: 1 %
Hematocrit: 41.1 % (ref 34.0–46.6)
Hemoglobin: 13.6 g/dL (ref 11.1–15.9)
Immature Grans (Abs): 0 10*3/uL (ref 0.0–0.1)
Immature Granulocytes: 0 %
Lymphocytes Absolute: 1.8 10*3/uL (ref 0.7–3.1)
Lymphs: 27 %
MCH: 32.1 pg (ref 26.6–33.0)
MCHC: 33.1 g/dL (ref 31.5–35.7)
MCV: 97 fL (ref 79–97)
Monocytes Absolute: 0.6 10*3/uL (ref 0.1–0.9)
Monocytes: 9 %
Neutrophils Absolute: 4 10*3/uL (ref 1.4–7.0)
Neutrophils: 62 %
Platelets: 221 10*3/uL (ref 150–450)
RBC: 4.24 x10E6/uL (ref 3.77–5.28)
RDW: 11.6 % — ABNORMAL LOW (ref 11.7–15.4)
WBC: 6.5 10*3/uL (ref 3.4–10.8)

## 2020-05-06 LAB — THYROID PANEL WITH TSH
Free Thyroxine Index: 2.2 (ref 1.2–4.9)
T3 Uptake Ratio: 30 % (ref 24–39)
T4, Total: 7.4 ug/dL (ref 4.5–12.0)
TSH: 1.32 u[IU]/mL (ref 0.450–4.500)

## 2020-05-06 LAB — COMPREHENSIVE METABOLIC PANEL
ALT: 16 IU/L (ref 0–32)
AST: 18 IU/L (ref 0–40)
Albumin/Globulin Ratio: 2.1 (ref 1.2–2.2)
Albumin: 5 g/dL — ABNORMAL HIGH (ref 3.8–4.8)
Alkaline Phosphatase: 61 IU/L (ref 48–121)
BUN/Creatinine Ratio: 17 (ref 9–23)
BUN: 13 mg/dL (ref 6–20)
Bilirubin Total: 0.6 mg/dL (ref 0.0–1.2)
CO2: 23 mmol/L (ref 20–29)
Calcium: 10 mg/dL (ref 8.7–10.2)
Chloride: 102 mmol/L (ref 96–106)
Creatinine, Ser: 0.78 mg/dL (ref 0.57–1.00)
GFR calc Af Amer: 113 mL/min/{1.73_m2} (ref >59)
GFR calc non Af Amer: 98 mL/min/{1.73_m2} (ref >59)
Globulin, Total: 2.4 g/dL (ref 1.5–4.5)
Glucose: 82 mg/dL (ref 65–99)
Potassium: 4.5 mmol/L (ref 3.5–5.2)
Sodium: 138 mmol/L (ref 134–144)
Total Protein: 7.4 g/dL (ref 6.0–8.5)

## 2020-05-06 LAB — VITAMIN D 25 HYDROXY (VIT D DEFICIENCY, FRACTURES): Vit D, 25-Hydroxy: 46.9 ng/mL (ref 30.0–100.0)

## 2020-05-06 LAB — ANA: Anti Nuclear Antibody (ANA): NEGATIVE

## 2020-05-06 LAB — SEDIMENTATION RATE: Sed Rate: 2 mm/hr (ref 0–32)

## 2020-05-06 LAB — EPSTEIN-BARR VIRUS VCA, IGG: EBV VCA IgG: 213 U/mL — ABNORMAL HIGH (ref 0.0–17.9)

## 2020-05-06 LAB — C-REACTIVE PROTEIN: CRP: <1 mg/L (ref 0–10)

## 2020-05-06 LAB — VITAMIN B12: Vitamin B-12: 697 pg/mL (ref 232–1245)

## 2020-05-08 ENCOUNTER — Other Ambulatory Visit: Payer: Self-pay | Admitting: Nurse Practitioner

## 2020-05-08 DIAGNOSIS — Z8616 Personal history of COVID-19: Secondary | ICD-10-CM

## 2020-05-08 DIAGNOSIS — M791 Myalgia, unspecified site: Secondary | ICD-10-CM

## 2020-05-08 DIAGNOSIS — G9331 Postviral fatigue syndrome: Secondary | ICD-10-CM

## 2020-05-08 DIAGNOSIS — M79604 Pain in right leg: Secondary | ICD-10-CM

## 2020-05-09 ENCOUNTER — Telehealth: Payer: Self-pay | Admitting: Neurology

## 2020-05-09 DIAGNOSIS — R202 Paresthesia of skin: Secondary | ICD-10-CM

## 2020-05-09 NOTE — Telephone Encounter (Signed)
Dr.WIllis pt would like MRI to be order now. IT was discuss at last office visit in January 2021.

## 2020-05-09 NOTE — Telephone Encounter (Signed)
Pt called and stated that she is ready to go ahead and schedule the MRI that was mentioned to her in her appt in Jan. Please advise.

## 2020-05-11 NOTE — Telephone Encounter (Signed)
Received call from patient checking on MRI order. Advised her the message was sent to Dr Anne Hahn who is out of office this week. I advised her he'll check messages when he returns. She'll get a call wen insurance has approved MRI, and it's ready to be scheduled.  She stated she may call Tues to be sure Dr Anne Hahn has ordered MRI. Patient verbalized understanding, appreciation.

## 2020-05-11 NOTE — Telephone Encounter (Signed)
I will go ahead and order the MRI of the brain.

## 2020-05-12 ENCOUNTER — Other Ambulatory Visit: Payer: Self-pay

## 2020-05-12 ENCOUNTER — Ambulatory Visit: Payer: 59 | Attending: Nurse Practitioner

## 2020-05-12 VITALS — BP 112/68 | HR 74

## 2020-05-12 DIAGNOSIS — M6281 Muscle weakness (generalized): Secondary | ICD-10-CM | POA: Diagnosis not present

## 2020-05-12 NOTE — Therapy (Signed)
[facility] [address] [place], [place], [address] Phone: [phone]   Fax:  [phone]  Physical Therapy Evaluation  Patient Details  Name: Jeanette Rice MRN: [id number] Date of Birth: May 08, 1986 Referring Provider (PT): Angus Seller, NP   Encounter Date: 05/12/2020   PT End of Session - 05/12/20 1037    Visit Number 1    Number of Visits 3    PT Start Time 1027    PT Stop Time 1114    PT Time Calculation (min) 47 min    Activity Tolerance Patient tolerated treatment well    Behavior During Therapy [facility] for tasks assessed/performed           Past Medical History:  Diagnosis Date  . COVID-13 June 2019    Past Surgical History:  Procedure Laterality Date  . WISDOM TOOTH EXTRACTION      Vitals:   05/12/20 1043  BP: 112/68  Pulse: 74  SpO2: 99%      Subjective Assessment - 05/12/20 1030    Subjective Pt was referred from Covid care clinic. She had Covid July 2020. Had vaccine this past April. Pt reports that her legs still very heavy and she is easily exhausted. She has not returned to working out at all due to her fatigue. She tries to save her energy for her kids. Still has some brain fog but feels it has gotten better than at the beginning. Reports that she gets weird feelings in legs like a tingling. Has improved a lot from what it was right after Covid. Pt was not hospitalized with her Covid and has had no therapy. She did not have any issues with breathing. Pt also just came up postive with EBV which could indicate a mono and will be seeing infectious disease on Tuesday. Reports after the vaccine she was having more pain and stiffness but it has improved quite a bit. Also finds she gets pain in legs when on period. Taking naps in middle of day. No real change in headaches as she would get occasionally before Covid as well. Denies any dizziness, just more light headed if gets up quick at times.     Patient Stated Goals Pt wants to be able to have more energy and keep up with her kids (2 and 5).    Currently in Pain? No/denies   prolonged standing causes some pain behind knees             [facility] PT Assessment - 05/12/20 1037      Assessment   Medical Diagnosis physical deconditioning, muscle pain, h/o covid    Referring Provider (PT) Angus Seller, NP    Onset Date/Surgical Date --   Covid July of 2020   Hand Dominance Left;Right      Precautions   Precautions None      Balance Screen   Has the patient fallen in the past 6 months No    Has the patient had a decrease in activity level because of a fear of falling?  No    Is the patient reluctant to leave their home because of a fear of falling?  No      Home Nurse, mental health Private residence    Living Arrangements Spouse/significant other;Children    Available Help at Discharge Family    Type of Home House    Home Access Stairs to enter    Entrance Stairs-Number of Steps 4  Entrance Stairs-Rails Right    Home Layout Two level    Alternate Level Stairs-Number of Steps 12    Alternate Level Stairs-Rails Right;Left    Home Equipment None      Prior Function   Level of Independence Independent      Cognition   Overall Cognitive Status Impaired/Different from baseline   reports some brain fog still but much improved     Sensation   Light Touch Appears Intact    Additional Comments Reporting some tingling between shoulder blades at times      ROM / Strength   AROM / PROM / Strength Strength      Strength   Strength Assessment Site Shoulder;Elbow;Hand;Hip;Knee;Ankle    Right/Left Shoulder Right;Left    Right Shoulder Flexion 4+/5    Left Shoulder Flexion 4+/5    Right/Left Elbow Right;Left    Right Elbow Flexion 4+/5    Right Elbow Extension 5/5    Left Elbow Flexion 4+/5    Left Elbow Extension 5/5    Right/Left hand Right;Left    Right Hand Gross Grasp Functional    Left Hand Gross Grasp  Functional    Right/Left Hip Right;Left    Right Hip Flexion 4+/5    Right Hip Extension 5/5    Right Hip ABduction 4+/5    Left Hip Flexion 4+/5    Left Hip Extension 5/5    Left Hip ABduction 4+/5    Right/Left Knee Right;Left    Right Knee Flexion 5/5    Right Knee Extension 5/5    Left Knee Flexion 5/5    Left Knee Extension 5/5    Right/Left Ankle Right;Left    Right Ankle Dorsiflexion 5/5    Left Ankle Dorsiflexion 5/5      Bed Mobility   Bed Mobility Rolling Right;Rolling Left;Supine to Sit;Sit to Supine    Rolling Right Independent    Rolling Left Independent    Supine to Sit Independent    Sit to Supine Independent      Transfers   Transfers Sit to Stand;Stand to Sit    Sit to Stand 7: Independent    Stand to Sit 7: Independent      Ambulation/Gait   Ambulation/Gait Yes    Ambulation/Gait Assistance 7: Independent    Ambulation Distance (Feet) 1380 Feet    Assistive device None    Gait Pattern Step-through pattern    Ambulation Surface Level;Indoor    Stairs Yes    Stairs Assistance 7: Independent    Stair Management Technique No rails;Alternating pattern    Number of Stairs 4      6 Minute Walk- Baseline   6 Minute Walk- Baseline yes    BP (mmHg) 112/68    HR (bpm) 74    02 Sat (%RA) 99 %      6 Minute walk- Post Test   6 Minute Walk Post Test yes    BP (mmHg) 114/68    HR (bpm) 72    02 Sat (%RA) 100 %    Modified Borg Scale for Dyspnea 5- Strong or hard breathing      6 minute walk test results    Aerobic Endurance Distance Walked 1380      High Level Balance   High Level Balance Comments Pt able to perform SLS >20 sec each leg                      Objective measurements completed on examination:  See above findings.               PT Education - 05/12/20 1124    Education Details PT discussed PT plan of care. Would see pt for a couple visits to help establish gym program and instruct in how to gradually increase  activity level. Also ways for energy conservation to help with fatigue. PT also discussed starting with aeobic activity on treadmill or elliptical at her home gym for 8-10 minutes and gradually increase each week trying to get up to goal of 20-30 min 5x/week. Discussed performing more functional strengthening like squat or sit to stand for legs.    Person(s) Educated Patient    Methods Explanation;Demonstration    Comprehension Verbalized understanding            PT Short Term Goals - 05/12/20 1139      PT SHORT TERM GOAL #1   Title STGs=LTGs             PT Long Term Goals - 05/12/20 1139      PT LONG TERM GOAL #1   Title Pt will be independent in gym based HEP focusing mostly on aerobic training and functional strengthening to continue on own.    Time 3    Period Weeks    Status New    Target Date 06/02/20      PT LONG TERM GOAL #2   Title Pt will be able to tolerate >10 min of moderate intensity exercise with <5/10 RPE for improved function.    Time 3    Period Weeks    Status New    Target Date 06/02/20      PT LONG TERM GOAL #3   Title Pt will increase 6 min walk from 1380' to >1500' for improved activity tolerance.    Baseline 1380'    Time 3    Period Weeks    Status New    Target Date 06/02/20                  Plan - 05/12/20 1127    Clinical Impression Statement Pt was referred for physical deconditioning and muscle pain with h/o of Covid-19 in July of 2020. Pt also reports set back after getting vaccine in April. She reports muscle pain has much improved since right after vaccine but continues to have a heaviness in legs. Fatigue is major issue for her. Pt was able to complete 6 min walk covering 1380' with 5/10 RPE reported at end. This is decreased for normal, healthy adult. Good vital sign response. Pt did not demonstrate any balance issues during testing. Strength 4+/5 to 5/5 throughout. Pt's biggest issues is fatigue. Will benefit from skilled PT to  establish gym program to help her gradually increase her activity level.    Examination-Activity Limitations Locomotion Level    Examination-Participation Restrictions Community Activity    Stability/Clinical Decision Making Stable/Uncomplicated    Clinical Decision Making Low    Rehab Potential Good    PT Frequency 1x / week    PT Duration 3 weeks    PT Treatment/Interventions Therapeutic activities;Therapeutic exercise;Energy conservation;Patient/family education    PT Next Visit Plan Pt going to call back if she decides to return to therapy. Needs to check on copay as well. If returns work on establishing gym-based program with gradually increasing aerobic activity on treadmill or elliptical and functiona strengthening with squats. Biggest issue is general fatigue.    Consulted and Agree with Plan of  Care Patient           Patient will benefit from skilled therapeutic intervention in order to improve the following deficits and impairments:  Decreased activity tolerance, Decreased strength  Visit Diagnosis: Muscle weakness (generalized)     Problem List Patient Active Problem List   Diagnosis Date Noted  . History of COVID-19 05/03/2020  . Fatigue 05/03/2020  . Muscle pain 05/03/2020  . Lower extremity pain, bilateral 05/03/2020  . Physical deconditioning 05/03/2020  . Paresthesia 12/14/2019  . Normal labor 01/21/2018  . Postpartum care following vaginal delivery 01/21/2018    Ronn Melena , PT, DPT, NCS 05/12/2020, 11:47 AM  [facility] [address] [place], [place], [address] Phone: [phone]   Fax:  [phone]  Name: Jeanette Rice MRN: [id number] Date of Birth: 02/21/1986

## 2020-05-15 ENCOUNTER — Telehealth: Payer: Self-pay | Admitting: Neurology

## 2020-05-15 NOTE — Telephone Encounter (Signed)
no to the covid questions MR Brain w/wo contrast Dr. [facility] Auth: [id number] (exp. 05/15/20 to 08/13/20). Patient is scheduled at [facility] for 05/23/20.

## 2020-05-15 NOTE — Telephone Encounter (Signed)
[facility]  

## 2020-05-16 ENCOUNTER — Other Ambulatory Visit: Payer: Self-pay

## 2020-05-16 ENCOUNTER — Encounter: Payer: Self-pay | Admitting: Infectious Disease

## 2020-05-16 ENCOUNTER — Ambulatory Visit: Payer: 59 | Admitting: Infectious Disease

## 2020-05-16 VITALS — BP 121/75 | HR 86 | Temp 97.9°F | Wt 139.2 lb

## 2020-05-16 DIAGNOSIS — Z8616 Personal history of COVID-19: Secondary | ICD-10-CM

## 2020-05-16 DIAGNOSIS — M791 Myalgia, unspecified site: Secondary | ICD-10-CM

## 2020-05-16 DIAGNOSIS — R5381 Other malaise: Secondary | ICD-10-CM

## 2020-05-16 DIAGNOSIS — R5383 Other fatigue: Secondary | ICD-10-CM

## 2020-05-16 DIAGNOSIS — Z113 Encounter for screening for infections with a predominantly sexual mode of transmission: Secondary | ICD-10-CM

## 2020-05-16 NOTE — Progress Notes (Signed)
Subjective:   Reason for ID consult: post COVID symptoms and + EBV Viral capsid IgG   Patient ID: Jeanette Rice, female    DOB: September 10, 1986, 34 y.o.   MRN: [id number]  HPI  34 year old previously healthy Caucasian woman who developed COVID 33 in July of 2020 along w her husband. She had loss of taste and smell temporarily and profound fatigue.  She also had a heaviness in the legs.  The fatigue, along with "brain fog" some cramping in the lower extremities with weakness and sensation of heaviness have persisted ever since.  She has not been able to get back much energy.  She is quite busy with 2 young children and certainly is not able to get as much rest as she might though she in the past she was able to feel a lot more energy and was not suffering from the symptoms.  She had vaccination with the [employer] vaccine in April and actually had worsening of her symptoms rather than improvement as some individuals have had with post Covid symptoms.  She been evaluated by neurology and by the post Covid clinic.  MRI of the brain is planned by neurology.  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  I reassured her that the EBV viral capsid IgG is a long-term memory antibody and the fact that it is high and titer shows that she has strong immunity.  Quite happy to do the full panel for EBV but I doubt that she has active EBV at this point in time.     Past Medical History:  Diagnosis Date  . COVID-13 June 2019    Past Surgical History:  Procedure Laterality Date  . WISDOM TOOTH EXTRACTION      Family History  Problem Relation Age of Onset  . Healthy Mother   . Healthy Father       Social History   Socioeconomic History  . Marital status: Married    Spouse name: Not on file  . Number of children: 2  . Years of education: college  . Highest education  level: Not on file  Occupational History  . Occupation: Unemployed  Tobacco Use  . Smoking status: Never Smoker  . Smokeless tobacco: Never Used  Substance and Sexual Activity  . Alcohol use: No  . Drug use: No  . Sexual activity: Not on file  Other Topics Concern  . Not on file  Social History Narrative   Lives at home with family.   Leftt-handed.   1 cup caffeine per day.   Social Determinants of Health   Financial Resource Strain:   . Difficulty of Paying Living Expenses:   Food Insecurity:   . Worried About Programme researcher, broadcasting/film/video in the Last Year:   . Barista in the Last Year:   Transportation Needs:   . Freight forwarder (Medical):   Marland Kitchen Lack of Transportation (Non-Medical):   Physical Activity:   . Days of Exercise per Week:   . Minutes of Exercise per Session:   Stress:   . Feeling of Stress :   Social Connections:   . Frequency of Communication with Friends and Family:   . Frequency of Social Gatherings with Friends and Family:   . Attends Religious Services:   . Active Member of Clubs or Organizations:   .  Attends Archivist Meetings:   Marland Kitchen Marital Status:     No Known Allergies  No current outpatient medications on file.  Review of Systems  Constitutional: Positive for activity change and fatigue. Negative for appetite change, chills, diaphoresis, fever and unexpected weight change.  HENT: Negative for congestion, rhinorrhea, sinus pressure, sneezing, sore throat and trouble swallowing.   Eyes: Negative for photophobia and visual disturbance.  Respiratory: Negative for cough, chest tightness, shortness of breath, wheezing and stridor.   Cardiovascular: Negative for chest pain, palpitations and leg swelling.  Gastrointestinal: Negative for abdominal distention, abdominal pain, anal bleeding, blood in stool, constipation, diarrhea, nausea and vomiting.  Genitourinary: Negative for difficulty urinating, dysuria, flank pain and hematuria.    Musculoskeletal: Positive for myalgias. Negative for arthralgias, back pain, gait problem and joint swelling.  Skin: Negative for color change, pallor, rash and wound.  Neurological: Negative for dizziness, tremors, weakness and light-headedness.  Hematological: Negative for adenopathy. Does not bruise/bleed easily.  Psychiatric/Behavioral: Negative for agitation, behavioral problems, confusion, decreased concentration, dysphoric mood and sleep disturbance.       Objective:   Physical Exam Constitutional:      General: She is not in acute distress.    Appearance: Normal appearance. She is well-developed. She is not ill-appearing or diaphoretic.  HENT:     Head: Normocephalic and atraumatic.     Right Ear: Hearing and external ear normal.     Left Ear: Hearing and external ear normal.     Nose: No nasal deformity or rhinorrhea.  Eyes:     General: No scleral icterus.    Conjunctiva/sclera: Conjunctivae normal.     Right eye: Right conjunctiva is not injected.     Left eye: Left conjunctiva is not injected.     Pupils: Pupils are equal, round, and reactive to light.  Neck:     Vascular: No JVD.  Cardiovascular:     Rate and Rhythm: Normal rate and regular rhythm.     Heart sounds: Normal heart sounds, S1 normal and S2 normal. No murmur heard.  No friction rub.  Pulmonary:     Effort: Pulmonary effort is normal. No respiratory distress.     Breath sounds: Normal breath sounds. No stridor. No wheezing or rhonchi.  Abdominal:     General: Bowel sounds are normal. There is no distension.     Palpations: Abdomen is soft.     Tenderness: There is no abdominal tenderness.  Musculoskeletal:        General: Normal range of motion.     Right shoulder: Normal.     Left shoulder: Normal.     Cervical back: Normal range of motion and neck supple.     Right hip: Normal.     Left hip: Normal.     Right knee: Normal.     Left knee: Normal.  Lymphadenopathy:     Head:     Right side of  head: No submandibular, preauricular or posterior auricular adenopathy.     Left side of head: No submandibular, preauricular or posterior auricular adenopathy.     Cervical: No cervical adenopathy.     Right cervical: No superficial or deep cervical adenopathy.    Left cervical: No superficial or deep cervical adenopathy.  Skin:    General: Skin is warm and dry.     Coloration: Skin is not pale.     Findings: No abrasion, bruising, ecchymosis, erythema, lesion or rash.     Nails: There is no  clubbing.  Neurological:     Mental Status: She is alert and oriented to person, place, and time.     Sensory: No sensory deficit.     Coordination: Coordination normal.     Gait: Gait normal.  Psychiatric:        Attention and Perception: Attention and perception normal. She is attentive.        Mood and Affect: Affect normal.        Speech: Speech normal.        Behavior: Behavior normal. Behavior is cooperative.        Thought Content: Thought content normal.        Cognition and Memory: Cognition and memory normal.        Judgment: Judgment normal.           Assessment & Plan:  Post COVID fatigue, myalgias: this has been reported in the literature. 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  Myalgias: I will check a CPK and rheumatoid factor but I doubt that she has an autoimmune condition I think this are all post Covid related symptoms

## 2020-05-19 LAB — HEPATITIS C ANTIBODY
Hepatitis C Ab: NONREACTIVE
SIGNAL TO CUT-OFF: 0.02 (ref <1.00)

## 2020-05-19 LAB — EPSTEIN-BARR VIRUS VCA ANTIBODY PANEL
EBV NA IgG: 414 U/mL — ABNORMAL HIGH
EBV VCA IgG: 204 U/mL — ABNORMAL HIGH
EBV VCA IgM: <36 U/mL

## 2020-05-19 LAB — CK: Total CK: 85 U/L (ref 29–143)

## 2020-05-19 LAB — CMV IGM: CMV IgM: <30 AU/mL

## 2020-05-19 LAB — RHEUMATOID FACTOR: Rheumatoid fact SerPl-aCnc: <14 IU/mL (ref <14)

## 2020-05-19 LAB — HEPATITIS B SURFACE ANTIGEN: Hepatitis B Surface Ag: NONREACTIVE

## 2020-05-19 LAB — HEPATITIS B SURFACE ANTIBODY,QUALITATIVE: Hep B S Ab: NONREACTIVE

## 2020-05-19 LAB — HIV ANTIBODY (ROUTINE TESTING W REFLEX): HIV 1&2 Ab, 4th Generation: NONREACTIVE

## 2020-05-19 LAB — CYTOMEGALOVIRUS ANTIBODY, IGG: Cytomegalovirus Ab-IgG: 6 U/mL — ABNORMAL HIGH

## 2020-05-22 ENCOUNTER — Telehealth: Payer: Self-pay

## 2020-05-22 NOTE — Telephone Encounter (Signed)
I have reviewed all of her labs, is she on mychart?

## 2020-05-22 NOTE — Telephone Encounter (Signed)
Yes she has MyChart

## 2020-05-22 NOTE — Telephone Encounter (Signed)
Patient calling requesting lab results. Routing to MD for review. Valarie Cones

## 2020-05-23 ENCOUNTER — Ambulatory Visit: Payer: 59

## 2020-05-23 ENCOUNTER — Other Ambulatory Visit: Payer: Self-pay

## 2020-05-23 DIAGNOSIS — R202 Paresthesia of skin: Secondary | ICD-10-CM

## 2020-05-23 MED ORDER — GADOBENATE DIMEGLUMINE 529 MG/ML IV SOLN
12.0000 mL | Freq: Once | INTRAVENOUS | Status: AC | PRN
  Administered 2020-05-23: 12 mL via INTRAVENOUS

## 2020-05-23 NOTE — Telephone Encounter (Signed)
We cam review them in a video visit or when she has folllowup appt. Which does she prefer or are there specific questions I can answer via mychart?

## 2020-05-24 ENCOUNTER — Telehealth: Payer: Self-pay

## 2020-05-24 NOTE — Telephone Encounter (Signed)
Received Mychart response from Dr. Daiva Eves explaining lab results. LPN relayed information to patient. Valarie Cones

## 2020-05-25 ENCOUNTER — Telehealth: Payer: Self-pay | Admitting: Neurology

## 2020-05-25 NOTE — Telephone Encounter (Signed)
  I called the patient.  MRI of the brain was normal, no evidence of demyelinating disease.  Problems with fatigue is chronic following a Covid infection.  She had a vaccination done in April which seemed to make her worse, it was a [employer] vaccination.  She will be undergoing acupuncture, if this is not helpful, we may consider use of Adderall for the fatigue and cognitive focusing problems.   MRI brain 05/25/20:  IMPRESSION:   Normal MRI brain (with and without).

## 2020-05-30 ENCOUNTER — Telehealth: Payer: Self-pay | Admitting: Family Medicine

## 2020-05-30 NOTE — Telephone Encounter (Signed)
Pt was contacted to reschedule appt on 7/15. Provider is no longer available. No answer VM left

## 2020-06-08 ENCOUNTER — Ambulatory Visit: Payer: No Typology Code available for payment source

## 2020-06-20 ENCOUNTER — Other Ambulatory Visit: Payer: Self-pay

## 2020-06-20 ENCOUNTER — Ambulatory Visit (INDEPENDENT_AMBULATORY_CARE_PROVIDER_SITE_OTHER): Payer: 59 | Admitting: Nurse Practitioner

## 2020-06-20 VITALS — HR 80 | Temp 97.7°F

## 2020-06-20 DIAGNOSIS — Z8616 Personal history of COVID-19: Secondary | ICD-10-CM

## 2020-06-20 DIAGNOSIS — G933 Postviral fatigue syndrome: Secondary | ICD-10-CM | POA: Diagnosis not present

## 2020-06-20 DIAGNOSIS — I499 Cardiac arrhythmia, unspecified: Secondary | ICD-10-CM | POA: Diagnosis not present

## 2020-06-20 DIAGNOSIS — G9331 Postviral fatigue syndrome: Secondary | ICD-10-CM

## 2020-06-20 NOTE — Assessment & Plan Note (Addendum)
Muscle pain in lower extremities Fatigue:  Assessment: Slowly improving - acupuncture seems to be helping with pain. Patient refuses PT. Encouraged to start low impact exercise routine gradually.    Plan:  Stay active - please start walking routine or yoga routine daily  Stay well hydrated  May continue accupuncture   Memory Loss:  Please follow up with neurology as needed   Irregular heart rhythm:  Assessment: EKG showed: NSR with sinus arrhythmia - this is new since last visit - most likely PVC's, but considering severe fatigue as well will go ahead and refer to cardiology for further evaluation.   Plan:   Will place referral to cardiology   Follow up:  Follow up as needed

## 2020-06-20 NOTE — Progress Notes (Signed)
@Patient  ID: , female    DOB: [birth year], 34 y.o.   MRN: [id number]  Chief Complaint  Patient presents with  . Follow-up    fatigue and memory loss after covid    Referring provider: [id number], MD  HPI  Patient presents today for post Covid care clinic visit follow-up.  Patient had Covid in July 2020.  She has had both vaccines.  Patient states that she is slowly improving since her last visit here.  She did follow-up with neurology and had MRI completed which was overall normal.  She also followed up with infectious disease with a normal work-up.  Patient states that she continues to have ongoing severe fatigue, memory loss, and lower extremity pain.  Patient has started acupuncture and states that this does help with her leg pain.  She was advised by neurology that if her memory loss continues with fatigue that they could trial Adderall to help with this.  Patient was evaluated by physical therapy but at this time refuses to continue with physical therapy she states that she can do this on her own.  When asked if she was working out or physically active she states that she is not at this time.  She is not doing any workouts on her own.  She does lead a sedentary lifestyle.  She is not currently working. Denies f/c/s, n/v/d, hemoptysis, PND, chest pain or edema.        No Known Allergies  Immunization History  Administered Date(s) Administered  . Influenza, Seasonal, Injecte, Preservative Fre 09/12/2016  . Influenza,inj,Quad PF,6+ Mos 09/02/2017  . Influenza,inj,quad, With Preservative 08/31/2018  . Tdap 11/25/2014, 12/01/2017    Past Medical History:  Diagnosis Date  . COVID-13 June 2019    Tobacco History: Social History   Tobacco Use  Smoking Status Never Smoker  Smokeless Tobacco Never Used   Counseling given: Yes   No outpatient encounter medications on file as of 06/20/2020.   No facility-administered encounter medications on file as of  06/20/2020.     Review of Systems  Review of Systems  Constitutional: Positive for fatigue. Negative for chills and fever.  HENT: Negative.   Respiratory: Negative for cough and shortness of breath.   Cardiovascular: Negative.  Negative for chest pain, palpitations and leg swelling.  Gastrointestinal: Negative.   Musculoskeletal: Positive for myalgias.  Allergic/Immunologic: Negative.   Neurological: Negative.   Psychiatric/Behavioral: Positive for decreased concentration.       Physical Exam  Pulse 80   Temp 97.7 F (36.5 C)   SpO2 99% Comment: RA  Wt Readings from Last 5 Encounters:  05/16/20 139 lb 3.2 oz (63.1 kg)  12/14/19 136 lb 8 oz (61.9 kg)  01/21/18 162 lb (73.5 kg)     Physical Exam Vitals and nursing note reviewed.  Constitutional:      General: She is not in acute distress.    Appearance: She is well-developed.  Cardiovascular:     Rate and Rhythm: Normal rate. Rhythm irregular.  Pulmonary:     Effort: Pulmonary effort is normal.     Breath sounds: Normal breath sounds.  Musculoskeletal:     Right lower leg: No edema.     Left lower leg: No edema.  Neurological:     Mental Status: She is alert and oriented to person, place, and time.  Psychiatric:        Mood and Affect: Mood normal.  Behavior: Behavior normal.     Imaging: MR BRAIN W WO CONTRAST  Result Date: 05/25/2020 [facility] NEUROIMAGING REPORT STUDY DATE: 05/23/20 PATIENT NAME: DELLENE MCGROARTY DOB: 1986/04/28 MRN: [id number] ORDERING CLINICIAN: York Spaniel, MD CLINICAL HISTORY: 34 year old female with numbness. EXAM: MR BRAIN W WO CONTRAST TECHNIQUE: MRI of the brain without contrast was obtained utilizing 5 mm axial slices with T1, T2, T2 flair, T2 star gradient echo and diffusion weighted views.  T1 sagittal and T2 coronal views were obtained. CONTRAST: 33ml multihance COMPARISON: none IMAGING SITE: [facility] [address] (1.5 Tesla MRI)  FINDINGS: No abnormal lesions are seen on diffusion-weighted views to suggest acute ischemia. The cortical sulci, fissures and cisterns are normal in size and appearance. Lateral, third and fourth ventricle are normal in size and appearance. No extra-axial fluid collections are seen. No evidence of mass effect or midline shift.  No abnormal lesions on post-contrast views. On sagittal views the posterior fossa, pituitary gland and corpus callosum are unremarkable. No evidence of intracranial hemorrhage on gradient echo views. The orbits and their contents, paranasal sinuses and calvarium are unremarkable.  Intracranial flow voids are present.   Normal MRI brain (with and without). INTERPRETING PHYSICIAN: Suanne Marker, MD Certified in Neurology, Neurophysiology and Neuroimaging [facility] [address] [place], [place] [address] [phone]     Assessment & Plan:   History of COVID-19 Muscle pain in lower extremities Fatigue:  Assessment: Slowly improving - acupuncture seems to be helping with pain. Patient refuses PT. Encouraged to start low impact exercise routine gradually.    Plan:  Stay active - please start walking routine or yoga routine daily  Stay well hydrated  May continue accupuncture   Memory Loss:  Please follow up with neurology as needed   Irregular heart rhythm:  Assessment: EKG showed: NSR with sinus arrhythmia - this is new since last visit - most likely PVC's, but considering severe fatigue as well will go ahead and refer to cardiology for further evaluation.   Plan:   Will place referral to cardiology   Follow up:  Follow up as needed   Patient Instructions  History of Covid Muscle pain in lower extremities Fatigue:  Stay active - please start walking routine or yoga routine daily  Stay well hydrated  May continue accupuncture   Memory Loss:  Please follow up with neurology as needed   Irregular heart  rhythm:  EKG showed: NSR with sinus arrhythmia   Will place referral to cardiology   Follow up:  Follow up as needed      Ivonne Andrew, NP 06/20/2020

## 2020-06-20 NOTE — Patient Instructions (Addendum)
History of Covid Muscle pain in lower extremities Fatigue:  Stay active - please start walking routine or yoga routine daily  Stay well hydrated  May continue accupuncture   Memory Loss:  Please follow up with neurology as needed   Irregular heart rhythm:  EKG showed: NSR with sinus arrhythmia   Will place referral to cardiology   Follow up:  Follow up as needed

## 2020-07-20 ENCOUNTER — Other Ambulatory Visit: Payer: Self-pay

## 2020-07-20 ENCOUNTER — Encounter: Payer: Self-pay | Admitting: *Deleted

## 2020-07-20 ENCOUNTER — Ambulatory Visit (INDEPENDENT_AMBULATORY_CARE_PROVIDER_SITE_OTHER): Payer: 59 | Admitting: Cardiology

## 2020-07-20 ENCOUNTER — Encounter: Payer: Self-pay | Admitting: Cardiology

## 2020-07-20 VITALS — BP 114/72 | HR 86 | Ht 64.0 in | Wt 139.4 lb

## 2020-07-20 DIAGNOSIS — R5382 Chronic fatigue, unspecified: Secondary | ICD-10-CM

## 2020-07-20 DIAGNOSIS — Z8616 Personal history of COVID-19: Secondary | ICD-10-CM

## 2020-07-20 DIAGNOSIS — I498 Other specified cardiac arrhythmias: Secondary | ICD-10-CM | POA: Diagnosis not present

## 2020-07-20 DIAGNOSIS — R0602 Shortness of breath: Secondary | ICD-10-CM | POA: Diagnosis not present

## 2020-07-20 DIAGNOSIS — I493 Ventricular premature depolarization: Secondary | ICD-10-CM

## 2020-07-20 DIAGNOSIS — I491 Atrial premature depolarization: Secondary | ICD-10-CM | POA: Insufficient documentation

## 2020-07-20 NOTE — Progress Notes (Signed)
Patient ID: Jeanette Rice, female   DOB: 1986-04-07, 34 y.o.   MRN: [id number] Patient enrolled for [employer] to ship a 7 day ZIO XT long term holter monitor to her home.  To be delivered 08/01/2020.

## 2020-07-20 NOTE — Progress Notes (Signed)
Primary Care Provider: Eartha Inch, MD Cardiologist: Bryan Lemma, MD Electrophysiologist: None  Clinic Note: Chief Complaint  Patient presents with  . 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  Marland Kitchen Palpitations    Irregular heartbeat, PVCs    HPI:    Jeanette Rice is a 34 y.o. 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  History of COVID-19 infection back in July 2020.  She had a history of fatigue prior to that infection, but now has had continuing fatigue and muscle pain in both lower extremities since her infection.  Neurology ordered an MRI but cannot be afforded.  Noted severe crawling sensation type pain in her legs.  SAHIAN Rice was recently seen on June 20, 2020 by Dr. Cyndia Bent.  She continues to note significant fatigue along with low extremity pain.  She has been doing acupuncture which seems to help leg discomfort.  She noted that she just was not feeling right in the chest.  She was having skipping beats and this unusual sensation in her chest.  She was told she had some irregular heartbeats and PVCs and was therefore referred for cardiology valuation. --> He denies PND orthopnea or edema.  Recent Hospitalizations: None  Reviewed  CV studies:    The following studies were reviewed today: (if available, images/films reviewed: From Epic Chart or Care Everywhere) . None:   Interval History:   [name] presents here for cardiology evaluation mostly seeking peace of mind.  She has been really troubled ever since her Covid 19 infection and wants to make sure that everything is fine with her heart.  There is definitely concern about PVCs noted on the fact that she is noticing some palpitations now. Thankfully, she is not having any chest pain or pressure with rest or exertion.  She not having  any PND orthopnea and no edema but is noticing some occasional episodes of dyspnea both at rest and with exertion.   More prominent however it is sensation of regular heartbeats.  Not necessarily fast just, not regular..  CV Review of Symptoms (Summary) Cardiovascular ROS: positive for - dyspnea on exertion, irregular heartbeat, palpitations, shortness of breath and Persistent fatigue negative for - chest pain, edema, orthopnea, paroxysmal nocturnal dyspnea, rapid heart rate or Syncope/near syncope or TIA/amaurosis fugax.  Claudication  The patient does not have symptoms concerning for COVID-19 infection (fever, chills, cough, or new shortness of breath).  The patient is practicing social distancing & Masking.    REVIEWED OF SYSTEMS   Review of Systems  Constitutional: Positive for malaise/fatigue. Negative for chills and fever.  HENT: Negative for congestion and nosebleeds.   Respiratory: Negative for cough, shortness of breath and wheezing.   Gastrointestinal: Negative for blood in stool and melena.  Genitourinary: Negative for hematuria.  Musculoskeletal: Positive for joint pain (Mild diffuse arthralgias.) and myalgias (Legs). Negative for falls.  Neurological: Positive for weakness (Legs). Negative for dizziness and focal weakness.  Psychiatric/Behavioral: The patient is nervous/anxious.        Having a hard time dealing with all the post viral syndrome symptoms.    I have reviewed and (if needed) personally updated the patient's problem list, medications, allergies, past medical and surgical history, social and family history.   PAST MEDICAL HISTORY   Past Medical History:  Diagnosis  Date  . COVID-19 virus infection    July 2020    PAST SURGICAL HISTORY   Past Surgical History:  Procedure Laterality Date  . WISDOM TOOTH EXTRACTION     Immunization History  Administered Date(s) Administered  . Influenza, Seasonal, Injecte, Preservative Fre 09/12/2016  .  Influenza,inj,Quad PF,6+ Mos 09/02/2017  . Influenza,inj,quad, With Preservative 08/31/2018  . PFIZER SARS-COV-2 Vaccination 03/23/2020, 04/14/2020  . Tdap 11/25/2014, 12/01/2017    MEDICATIONS/ALLERGIES   No outpatient medications have been marked as taking for the 07/20/20 encounter (Office Visit) with Marykay Lex, MD.    No Known Allergies  SOCIAL HISTORY/FAMILY HISTORY   Social History   Tobacco Use  . Smoking status: Never Smoker  . Smokeless tobacco: Never Used  Substance Use Topics  . Alcohol use: No  . Drug use: No   Social History   Social History Narrative   She is originally from the [place] area and moved to [place] for her husband's job.   Lives at home with family.-Husband and 2 children ages 2 and 5.   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   Leftt-handed.   1 cup caffeine per day.   Family History  Problem Relation Age of Onset  . Healthy Mother   . Healthy Father   . Congestive Heart Failure Maternal Grandmother 85     OBJCTIVE -PE, EKG, labs   Wt Readings from Last 3 Encounters:  07/20/20 139 lb 6.4 oz (63.2 kg)  05/16/20 139 lb 3.2 oz (63.1 kg)  12/14/19 136 lb 8 oz (61.9 kg)    Physical Exam: BP 114/72   Pulse 86   Ht  (1.626 m)   Wt 139 lb 6.4 oz (63.2 kg)   SpO2 100%   BMI 23.93 kg/m  Physical Exam Constitutional:      General: She is not in acute distress.    Appearance: Normal appearance. She is normal weight. She is not ill-appearing (This seems somewhat fatigued.).  HENT:     Head: Normocephalic and atraumatic.  Neck:     Vascular: No carotid bruit.     Comments: No JVD or HJR Cardiovascular:     Rate and Rhythm: Normal rate. Rhythm irregular. Frequent extrasystoles are present.    Chest Wall: PMI is not displaced.     Pulses: Normal pulses.     Heart sounds: Normal heart sounds. No murmur heard.  No friction rub. No gallop.   Pulmonary:     Effort: Pulmonary effort is normal. No respiratory distress.     Breath  sounds: Normal breath sounds.  Chest:     Chest wall: No tenderness.  Abdominal:     General: Abdomen is flat. Bowel sounds are normal. There is no distension.     Palpations: Abdomen is soft. There is no mass (No HSM).  Musculoskeletal:        General: No swelling. Normal range of motion.     Cervical back: Normal range of motion.  Neurological:     General: No focal deficit present.     Mental Status: She is alert and oriented to person, place, and time.  Psychiatric:        Mood and Affect: Mood normal.        Behavior: Behavior normal.        Thought Content: Thought content normal.        Judgment: Judgment normal.     Adult ECG Report  Rate: 86 ;  Rhythm: normal sinus rhythm,  sinus arrhythmia and Otherwise normal axis intervals and durations.;   Narrative Interpretation: Relatively normal EKG.  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  Need to exclude cardiac involvement.  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  With her having some arrhythmias and some exertional dyspnea, we will check 2D echo      Relevant Orders   ECHOCARDIOGRAM COMPLETE   LONG TERM MONITOR (3-14 DAYS)   Premature ventricular contractions    She mention hearing something about having PACs or PVCs.  I definitely hear ectopy on exam.  Plan: 1 week Zio patch and 2D echo      Relevant Orders    EKG 12-Lead (Completed)   ECHOCARDIOGRAM COMPLETE   LONG TERM MONITOR (3-14 DAYS)   Shortness of breath    Shortness of breath both at rest and fatigue on occasion.  Does not have classic CHF findings, but is noticing the dyspnea and ectopy.  At this point peace of mind is extremely important for her to develop cope with all the other issues that are going on with a post viral syndrome.  I agree with excluding any significant or structural abnormalities.    Plan: Check 2D echo        Relevant Orders   ECHOCARDIOGRAM COMPLETE   Arrhythmia, atrial    P wave is a little usual on some of the complexes.  Cannot exclude wandering pacemaker.  In light of her having had Covid with all the possibilities of potential cardiomyopathy, will check 2D echocardiogram to exclude structural abnormalities  Plan: Check 2D echo and Zio patch.      Relevant Orders   EKG 12-Lead (Completed)   LONG TERM MONITOR (3-14 DAYS)       COVID-19 Education: The signs and symptoms of COVID-19 were discussed with the patient and how to seek care for testing (follow up with PCP or arrange E-visit).   The importance of social distancing and COVID-19 vaccination was discussed today.  I spent a total of 28 minutes with the patient. >  50% of the time was spent in direct patient consultation.  Additional time spent with chart review  / charting (studies, outside notes, etc): 18 Total Time: 46 min   Current medicines are reviewed at length with the patient today.  (+/- concerns) N/A  Notice: This dictation was prepared with Dragon dictation along with smaller phrase technology. Any transcriptional errors that result from this process are unintentional and may not be corrected upon review.  Patient Instructions / Medication Changes & Studies & Tests Ordered   Patient Instructions  Medication Instructions:  NO CHANGES  *If you need a refill on your cardiac medications before your next appointment, please  call your pharmacy*   Lab Work: NOT  NEEDED   Testing/Procedures: WILL BED SCHEDULE AT [address] Your physician has requested that you have an echocardiogram. Echocardiography is a painless test that uses sound waves to create images of your heart. It provides your doctor with information about the size and shape of your heart and how well your heart's chambers and valves are working. This procedure takes approximately one hour. There are no restrictions for this procedure.  AND  Your physician has recommended that you wear a  7 DAY ZIO-PATCH monitor. The Zio patch cardiac monitor continuously records heart rhythm data for up to 14 days, this is for patients being evaluated for multiple types heart rhythms. For the first 24 hours post application, please avoid getting the Zio monitor wet in the shower or by excessive sweating during exercise. After that, feel free to carry on with regular activities. Keep soaps and lotions away from the ZIO XT Patch.  This will be mailed to you, please expect 7-10 days to receive.          Follow-Up: At [facility], you and your health needs are our priority.  As part of our continuing mission to provide you with exceptional heart care, we have created designated Provider Care Teams.  These Care Teams include your primary Cardiologist (physician) and Advanced Practice Providers (APPs -  Physician Assistants and Nurse Practitioners) who all work together to provide you with the care you need, when you need it.     Your next appointment:   2 month(s)  The format for your next appointment:   In Person  Provider:   Bryan Lemma, MD   Christena Deem- Long Term Monitor Instructions   Your physician has requested you wear your ZIO patch monitor__7_____days.   This is a single patch monitor.  [employer] supplies one patch monitor per enrollment.  Additional stickers are not available.   Please do not apply patch if you will be  having a Nuclear Stress Test, Echocardiogram, Cardiac CT, MRI, or Chest Xray during the time frame you would be wearing the monitor. The patch cannot be worn during these tests.  You cannot remove and re-apply the ZIO XT patch monitor.   Your ZIO patch monitor will be sent USPS Priority mail from [facility] directly to your home address. The monitor may also be mailed to a PO BOX if home delivery is not available.   It may take 3-5 days to receive your monitor after you have been enrolled.   Once you have received you monitor, please review enclosed instructions.  Your monitor has already been registered assigning a specific monitor serial # to you.   Applying the monitor   Shave hair from upper left chest.   Hold abrader disc by orange tab.  Rub abrader in 40 strokes over left upper chest as indicated in your monitor instructions.   Clean area with 4 enclosed alcohol pads .  Use all pads to assure are is cleaned thoroughly.  Let dry.   Apply patch as indicated in monitor instructions.  Patch will be place under collarbone on left side of chest with arrow pointing upward.   Rub patch adhesive wings for 2 minutes.Remove white label marked "1".  Remove white label marked "2".  Rub patch adhesive wings for 2 additional minutes.   While looking in a mirror, press and release button in center of patch.  A small green light will flash 3-4 times .  This will be your only indicator  the monitor has been turned on.     Do not shower for the first 24 hours.  You may shower after the first 24 hours.   Press button if you feel a symptom. You will hear a small click.  Record Date, Time and Symptom in the Patient Log Book.   When you are ready to remove patch, follow instructions on last 2 pages of Patient Log Book.  Stick patch monitor onto last page of Patient Log Book.   Place Patient Log Book in [place] box.  Use locking tab on box and tape box closed securely.  The Orange and [employer] box has  [employer] postage on it.  Please place in mailbox as soon as possible.  Your physician should have your test results approximately 7 days after the monitor has been mailed back to [facility].   Call [facility] Customer Care at [phone] if you have questions regarding your ZIO XT patch monitor.  Call them immediately if you see an orange light blinking on your monitor.   If your monitor falls off in less than 4 days contact our Monitor department at [phone].  If your monitor becomes loose or falls off after 4 days call [employer] at [phone] for suggestions on securing your monitor.        Studies Ordered:   Orders Placed This Encounter  Procedures  . LONG TERM MONITOR (3-14 DAYS)  . EKG 12-Lead  . ECHOCARDIOGRAM COMPLETE     Bryan Lemmaavid Niquita Digioia, M.D., M.S. Interventional Cardiologist   Pager # [phone] Phone # [phone] [address] [place], [place] [id number]   [facility] you for choosing [facility]!!

## 2020-07-20 NOTE — Patient Instructions (Signed)
Medication Instructions:  NO CHANGES  *If you need a refill on your cardiac medications before your next appointment, please call your pharmacy*   Lab Work: NOT NEEDED   Testing/Procedures: WILL BED SCHEDULE AT [address] Your physician has requested that you have an echocardiogram. Echocardiography is a painless test that uses sound waves to create images of your heart. It provides your doctor with information about the size and shape of your heart and how well your heart's chambers and valves are working. This procedure takes approximately one hour. There are no restrictions for this procedure.  AND  Your physician has recommended that you wear a  7 DAY ZIO-PATCH monitor. The Zio patch cardiac monitor continuously records heart rhythm data for up to 14 days, this is for patients being evaluated for multiple types heart rhythms. For the first 24 hours post application, please avoid getting the Zio monitor wet in the shower or by excessive sweating during exercise. After that, feel free to carry on with regular activities. Keep soaps and lotions away from the ZIO XT Patch.  This will be mailed to you, please expect 7-10 days to receive.          Follow-Up: At [facility], you and your health needs are our priority.  As part of our continuing mission to provide you with exceptional heart care, we have created designated Provider Care Teams.  These Care Teams include your primary Cardiologist (physician) and Advanced Practice Providers (APPs -  Physician Assistants and Nurse Practitioners) who all work together to provide you with the care you need, when you need it.     Your next appointment:   2 month(s)  The format for your next appointment:   In Person  Provider:   Bryan Lemma, MD   Christena Deem- Long Term Monitor Instructions   Your physician has requested you wear your ZIO patch monitor__7_____days.   This is a single patch monitor.  [employer]  supplies one patch monitor per enrollment.  Additional stickers are not available.   Please do not apply patch if you will be having a Nuclear Stress Test, Echocardiogram, Cardiac CT, MRI, or Chest Xray during the time frame you would be wearing the monitor. The patch cannot be worn during these tests.  You cannot remove and re-apply the ZIO XT patch monitor.   Your ZIO patch monitor will be sent USPS Priority mail from [facility] directly to your home address. The monitor may also be mailed to a PO BOX if home delivery is not available.   It may take 3-5 days to receive your monitor after you have been enrolled.   Once you have received you monitor, please review enclosed instructions.  Your monitor has already been registered assigning a specific monitor serial # to you.   Applying the monitor   Shave hair from upper left chest.   Hold abrader disc by orange tab.  Rub abrader in 40 strokes over left upper chest as indicated in your monitor instructions.   Clean area with 4 enclosed alcohol pads .  Use all pads to assure are is cleaned thoroughly.  Let dry.   Apply patch as indicated in monitor instructions.  Patch will be place under collarbone on left side of chest with arrow pointing upward.   Rub patch adhesive wings for 2 minutes.Remove white label marked "1".  Remove white label marked "2".  Rub patch adhesive wings for 2 additional minutes.   While looking  in a mirror, press and release button in center of patch.  A small green light will flash 3-4 times .  This will be your only indicator the monitor has been turned on.     Do not shower for the first 24 hours.  You may shower after the first 24 hours.   Press button if you feel a symptom. You will hear a small click.  Record Date, Time and Symptom in the Patient Log Book.   When you are ready to remove patch, follow instructions on last 2 pages of Patient Log Book.  Stick patch monitor onto last page of Patient Log  Book.   Place Patient Log Book in [place] box.  Use locking tab on box and tape box closed securely.  The [employer] has [employer] on it.  Please place in mailbox as soon as possible.  Your physician should have your test results approximately 7 days after the monitor has been mailed back to [facility].   Call [employer] Customer Care at [phone] if you have questions regarding your ZIO XT patch monitor.  Call them immediately if you see an orange light blinking on your monitor.   If your monitor falls off in less than 4 days contact our Monitor department at [phone].  If your monitor becomes loose or falls off after 4 days call [employer] at [phone] for suggestions on securing your monitor.

## 2020-07-23 ENCOUNTER — Encounter: Payer: Self-pay | Admitting: Cardiology

## 2020-07-23 NOTE — Assessment & Plan Note (Addendum)
She mention hearing something about having PACs or PVCs.  I definitely hear ectopy on exam.  Plan: 1 week Zio patch and 2D echo

## 2020-07-23 NOTE — Assessment & Plan Note (Signed)
P wave is a little usual on some of the complexes.  Cannot exclude wandering pacemaker.  In light of her having had Covid with all the possibilities of potential cardiomyopathy, will check 2D echocardiogram to exclude structural abnormalities  Plan: Check 2D echo and Zio patch.

## 2020-07-23 NOTE — Assessment & Plan Note (Signed)
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  Need to exclude cardiac involvement.  Plan: Check 2D echo

## 2020-07-23 NOTE — Assessment & Plan Note (Signed)
Likely post viral fatigue from COVID-19.  With her having some arrhythmias and some exertional dyspnea, we will check 2D echo

## 2020-07-23 NOTE — Assessment & Plan Note (Signed)
Shortness of breath both at rest and fatigue on occasion.  Does not have classic CHF findings, but is noticing the dyspnea and ectopy.  At this point peace of mind is extremely important for her to develop cope with all the other issues that are going on with a post viral syndrome.  I agree with excluding any significant or structural abnormalities.    Plan: Check 2D echo

## 2020-08-02 ENCOUNTER — Other Ambulatory Visit (INDEPENDENT_AMBULATORY_CARE_PROVIDER_SITE_OTHER): Payer: 59

## 2020-08-02 DIAGNOSIS — I498 Other specified cardiac arrhythmias: Secondary | ICD-10-CM | POA: Diagnosis not present

## 2020-08-02 DIAGNOSIS — R5382 Chronic fatigue, unspecified: Secondary | ICD-10-CM | POA: Diagnosis not present

## 2020-08-02 DIAGNOSIS — Z8616 Personal history of COVID-19: Secondary | ICD-10-CM | POA: Diagnosis not present

## 2020-08-02 DIAGNOSIS — I493 Ventricular premature depolarization: Secondary | ICD-10-CM

## 2020-08-03 ENCOUNTER — Ambulatory Visit: Payer: No Typology Code available for payment source | Admitting: Cardiovascular Disease

## 2020-08-11 ENCOUNTER — Ambulatory Visit (HOSPITAL_COMMUNITY): Payer: 59 | Attending: Cardiology

## 2020-08-11 ENCOUNTER — Other Ambulatory Visit: Payer: Self-pay

## 2020-08-11 DIAGNOSIS — I493 Ventricular premature depolarization: Secondary | ICD-10-CM | POA: Insufficient documentation

## 2020-08-11 DIAGNOSIS — Z8616 Personal history of COVID-19: Secondary | ICD-10-CM | POA: Diagnosis not present

## 2020-08-11 DIAGNOSIS — R0602 Shortness of breath: Secondary | ICD-10-CM | POA: Diagnosis present

## 2020-08-11 DIAGNOSIS — R5382 Chronic fatigue, unspecified: Secondary | ICD-10-CM | POA: Insufficient documentation

## 2020-08-11 LAB — ECHOCARDIOGRAM COMPLETE
Area-P 1/2: 3.03 cm2
S' Lateral: 2.5 cm

## 2020-09-28 ENCOUNTER — Ambulatory Visit (INDEPENDENT_AMBULATORY_CARE_PROVIDER_SITE_OTHER): Payer: 59 | Admitting: Cardiology

## 2020-09-28 ENCOUNTER — Encounter: Payer: Self-pay | Admitting: Cardiology

## 2020-09-28 ENCOUNTER — Other Ambulatory Visit: Payer: Self-pay

## 2020-09-28 VITALS — BP 115/60 | HR 87 | Temp 98.2°F | Ht 64.0 in | Wt 145.2 lb

## 2020-09-28 DIAGNOSIS — I491 Atrial premature depolarization: Secondary | ICD-10-CM | POA: Diagnosis not present

## 2020-09-28 DIAGNOSIS — R0602 Shortness of breath: Secondary | ICD-10-CM

## 2020-09-28 DIAGNOSIS — I498 Other specified cardiac arrhythmias: Secondary | ICD-10-CM

## 2020-09-28 NOTE — Progress Notes (Signed)
Primary Care Provider: Eartha Inch, MD Cardiologist: Bryan Lemma, MD Electrophysiologist: None  Clinic Note: Chief Complaint  Patient presents with  . Follow-up    Monitor results    HPI:    Jeanette Rice is a 34 y.o. female with a PMH with relatively recent COVID-19 infection post viral fatigue who presents today for follow-up visit for evaluation of fatigue and PVCs/irregular rhythm-originally seen at the request of Eartha Inch, MD / Ivonne Andrew, NP.  Problem List Items Addressed This Visit    Premature atrial contractions (Chronic)   Shortness of breath   Arrhythmia, atrial     Jeanette Rice was seen for initial consultation on July 20, 2020.  She recently TAVR from COVID-19 and was extremely fatigued.  She was having some palpitations and was noted to have potential irregular rhythm on EKG at her PCPs office.  There was some possible PVCs noted.  She is not having any chest pain, pressure or dyspnea on exertion or rest.  No heart failure symptoms.  No syncope or near syncope.  She felt prominent regular and irregular heartbeat/palpitations but nothing prolonged or rapid. ->  She was evaluated with an echocardiogram and a 7-day Zio patch monitor.  Recent Hospitalizations: None  Reviewed  CV studies:    The following studies were reviewed today: (if available, images/films reviewed: From Epic Chart or Care Everywhere) . TTE (08/11/2010): EF 60 to 65%.  Normal LV size, function.  No R WMA.  Normal diastolic function.  Normal RV size and function.  Mild biatrial enlargement.  Normal valves.  Mildly elevated right atrial pressures . 7-day Zio patch monitor (9/8-15/2021): Frequent isolated PACs (7.2% with rare couplets and triplets.  Rare PVCs with one episode of bigeminy.  4 episodes of PAT noted ranging from 4 beats at 156 bpm up to 9 beats-132 bpm.  Relatively benign.  Interval History:   Jeanette Rice returns today for follow-up stating that really the  last time she had episodes of palpitations was on October 14.  Otherwise really she does not have much in the way of any significant symptoms.  She is gradually getting energy level back.  It is a slow process, but she is starting to do more.  The palpitations have been there but not nearly like they were.  She is not having any dyspnea or chest discomfort.  No heart failure symptoms.  She is trying to gradually increase her exercise level.  Try to do naturopathic therapies.  PCP indicated mild iron deficiency anemia.  CV Review of Symptoms (Summary) Cardiovascular ROS: no chest pain or dyspnea on exertion positive for - irregular heartbeat, palpitations and Much less frequently.  Still has fatigue but no real dyspnea.. negative for - edema, loss of consciousness, orthopnea, paroxysmal nocturnal dyspnea, shortness of breath or Near syncope, TIA/amaurosis fugax, claudication  The patient does not have symptoms concerning for COVID-19 infection (fever, chills, cough, or new shortness of breath).   REVIEWED OF SYSTEMS   Pertinent symptoms noted above.  Not have any fevers or chills.  No coughing or wheezing.  Fatigue but no other signs or symptoms of illness.  I have reviewed and (if needed) personally updated the patient's problem list, medications, allergies, past medical and surgical history, social and family history.   PAST MEDICAL HISTORY   Past Medical History:  Diagnosis Date  . COVID-19 virus infection    July 2020    PAST SURGICAL HISTORY   Past Surgical  History:  Procedure Laterality Date  . WISDOM TOOTH EXTRACTION      Immunization History  Administered Date(s) Administered  . Influenza, Seasonal, Injecte, Preservative Fre 09/12/2016  . Influenza,inj,Quad PF,6+ Mos 09/02/2017  . Influenza,inj,quad, With Preservative 08/31/2018  . PFIZER SARS-COV-2 Vaccination 03/23/2020, 04/14/2020  . Tdap 11/25/2014, 12/01/2017    MEDICATIONS/ALLERGIES   No outpatient medications  have been marked as taking for the 09/28/20 encounter (Office Visit) with Marykay Lex, MD.  She is in fact not on any medications  No Known Allergies  SOCIAL HISTORY/FAMILY HISTORY   Reviewed in Epic:  Pertinent findings: No change  OBJCTIVE -PE, EKG, labs   Wt Readings from Last 3 Encounters:  09/28/20 145 lb 3.2 oz (65.9 kg)  07/20/20 139 lb 6.4 oz (63.2 kg)  05/16/20 139 lb 3.2 oz (63.1 kg)    Physical Exam: BP 115/60   Pulse 87   Temp 98.2 F (36.8 C)   Ht 5\' 4"  (1.626 m)   Wt 145 lb 3.2 oz (65.9 kg)   SpO2 99%   BMI 24.92 kg/m  Physical Exam Vitals reviewed.  Constitutional:      General: She is not in acute distress.    Appearance: Normal appearance. She is normal weight. She is not ill-appearing or toxic-appearing.  HENT:     Head: Normocephalic and atraumatic.  Cardiovascular:     Rate and Rhythm: Normal rate and regular rhythm. Occasional extrasystoles are present.    Pulses: Normal pulses.     Heart sounds: Normal heart sounds. No murmur heard.  No friction rub. No gallop.   Pulmonary:     Effort: No respiratory distress.  Musculoskeletal:        General: Normal range of motion.     Cervical back: Normal range of motion and neck supple.  Neurological:     General: No focal deficit present.     Mental Status: She is alert and oriented to person, place, and time.  Psychiatric:        Mood and Affect: Mood normal.        Behavior: Behavior normal.        Thought Content: Thought content normal.        Judgment: Judgment normal.     Adult ECG Report Not checked  Recent Labs:    No results found for: CHOL, HDL, LDLCALC, LDLDIRECT, TRIG, CHOLHDL Lab Results  Component Value Date   CREATININE 0.78 05/04/2020   BUN 13 05/04/2020   NA 138 05/04/2020   K 4.5 05/04/2020   CL 102 05/04/2020   CO2 23 05/04/2020   Lab Results  Component Value Date   TSH 1.320 05/04/2020    ASSESSMENT/PLAN    Problem List Items Addressed This Visit     Premature atrial contractions (Chronic)    More PACs and PVCs noted on monitor.  Probably do want to follow to see if she has any more atrial arrhythmias or atrial rhythm issues.  No real structural abnormalities besides mild atrial enlargement.  She is awfully young to have atrial fibrillation, however with all these PACs, it is possible that this may be harbinger of atrial fib or flutter.  As such, I think we probably at least follow-up every couple years just to make sure that she has not had any further arrhythmias.  Otherwise we will not do any treatment for now.      Shortness of breath    That seems to have improved.  No abnormal findings on  echo.  Not like to be related to cardiac issue.  More related to recent Covid.      Arrhythmia, atrial - Primary    She actually did have a short little bursts of PAT's atrial tachycardias.  Not that many and relatively short.  At this point I think the potential side effects of treating these palpitations would be more detrimental to her then simply reassurance.  Beta-blocker with her fatigue would not be good idea.  No structural abnormalities noted on echo.  We talked importance of staying adequately hydrated, avoiding triggers such as stress, anxiety, caffeine, chocolates, sweets etc.          COVID-19 Education: The signs and symptoms of COVID-19 were discussed with the patient and how to seek care for testing (follow up with PCP or arrange E-visit).   The importance of social distancing and COVID-19 vaccination was discussed today.  The patient is practicing social distancing & Masking.   I spent a total of with the patient spent in direct patient consultation.  Additional time spent with chart review  / charting (studies, outside notes, etc): 6 Total Time: 35 min   Current medicines are reviewed at length with the patient today.  (+/- concerns) n/a  This visit occurred during the SARS-CoV-2 public health emergency.   Safety protocols were in place, including screening questions prior to the visit, additional usage of staff PPE, and extensive cleaning of exam room while observing appropriate contact time as indicated for disinfecting solutions.  Notice: This dictation was prepared with Dragon dictation along with smaller phrase technology. Any transcriptional errors that result from this process are unintentional and may not be corrected upon review.  Patient Instructions / Medication Changes & Studies & Tests Ordered   Patient Instructions  Medication Instructions:  No changes  *If you need a refill on your cardiac medications before your next appointment, please call your pharmacy*   Lab Work: Not needed    Testing/Procedures: Not needed   Follow-Up: At [facility], you and your health needs are our priority.  As part of our continuing mission to provide you with exceptional heart care, we have created designated Provider Care Teams.  These Care Teams include your primary Cardiologist (physician) and Advanced Practice Providers (APPs -  Physician Assistants and Nurse Practitioners) who all work together to provide you with the care you need, when you need it.      Your next appointment:   2 year(s)  The format for your next appointment:   In Person  Provider:   Bryan Lemma, MD       Studies Ordered:   No orders of the defined types were placed in this encounter.    Bryan Lemma, M.D., M.S. Interventional Cardiologist   Pager # [phone] Phone # [phone] [address] [place], [place] [address]   Thank you for choosing Heartcare at [facility]!!

## 2020-09-28 NOTE — Patient Instructions (Signed)
Medication Instructions:  No changes *If you need a refill on your cardiac medications before your next appointment, please call your pharmacy*   Lab Work:  Not needed.   Testing/Procedures: Not needed   Follow-Up: At [facility], you and your health needs are our priority.  As part of our continuing mission to provide you with exceptional heart care, we have created designated Provider Care Teams.  These Care Teams include your primary Cardiologist (physician) and Advanced Practice Providers (APPs -  Physician Assistants and Nurse Practitioners) who all work together to provide you with the care you need, when you need it.   Your next appointment:   2 year(s)  The format for your next appointment:   In Person  Provider:   David Harding, MD  

## 2020-10-05 ENCOUNTER — Encounter: Payer: Self-pay | Admitting: Cardiology

## 2020-10-05 NOTE — Assessment & Plan Note (Signed)
She actually did have a short little bursts of PAT's atrial tachycardias.  Not that many and relatively short.  At this point I think the potential side effects of treating these palpitations would be more detrimental to her then simply reassurance.  Beta-blocker with her fatigue would not be good idea.  No structural abnormalities noted on echo.  We talked importance of staying adequately hydrated, avoiding triggers such as stress, anxiety, caffeine, chocolates, sweets etc.

## 2020-10-05 NOTE — Assessment & Plan Note (Signed)
That seems to have improved.  No abnormal findings on echo.  Not like to be related to cardiac issue.  More related to recent Covid.

## 2020-10-05 NOTE — Assessment & Plan Note (Addendum)
More PACs and PVCs noted on monitor.  Probably do want to follow to see if she has any more atrial arrhythmias or atrial rhythm issues.  No real structural abnormalities besides mild atrial enlargement.  She is awfully young to have atrial fibrillation, however with all these PACs, it is possible that this may be harbinger of atrial fib or flutter.  As such, I think we probably at least follow-up every couple years just to make sure that she has not had any further arrhythmias.  Otherwise we will not do any treatment for now.

## 2020-10-16 ENCOUNTER — Other Ambulatory Visit: Payer: Self-pay | Admitting: Obstetrics and Gynecology

## 2020-10-16 DIAGNOSIS — R928 Other abnormal and inconclusive findings on diagnostic imaging of breast: Secondary | ICD-10-CM

## 2020-10-31 ENCOUNTER — Ambulatory Visit: Admission: RE | Admit: 2020-10-31 | Payer: 59 | Source: Ambulatory Visit

## 2020-10-31 ENCOUNTER — Other Ambulatory Visit: Payer: Self-pay

## 2020-10-31 ENCOUNTER — Ambulatory Visit
Admission: RE | Admit: 2020-10-31 | Discharge: 2020-10-31 | Disposition: A | Discharge status: Home / Self Care | Payer: 59 | Source: Ambulatory Visit | Attending: Obstetrics and Gynecology | Admitting: Obstetrics and Gynecology

## 2020-10-31 DIAGNOSIS — R928 Other abnormal and inconclusive findings on diagnostic imaging of breast: Secondary | ICD-10-CM

## 2020-10-31 IMAGING — MG MM DIGITAL DIAGNOSTIC UNILAT*R* W/ TOMO W/ CAD
4 series · 4 of 12 positions shown · non-contrast
Comparison: Previous exam(s).

CLINICAL DATA: 34-year-old female for further evaluation of
possible RIGHT breast asymmetry on screening mammogram.

EXAM:
DIGITAL DIAGNOSTIC UNILATERAL RIGHT MAMMOGRAM WITH TOMO AND CAD

[R MLO synth-2D]
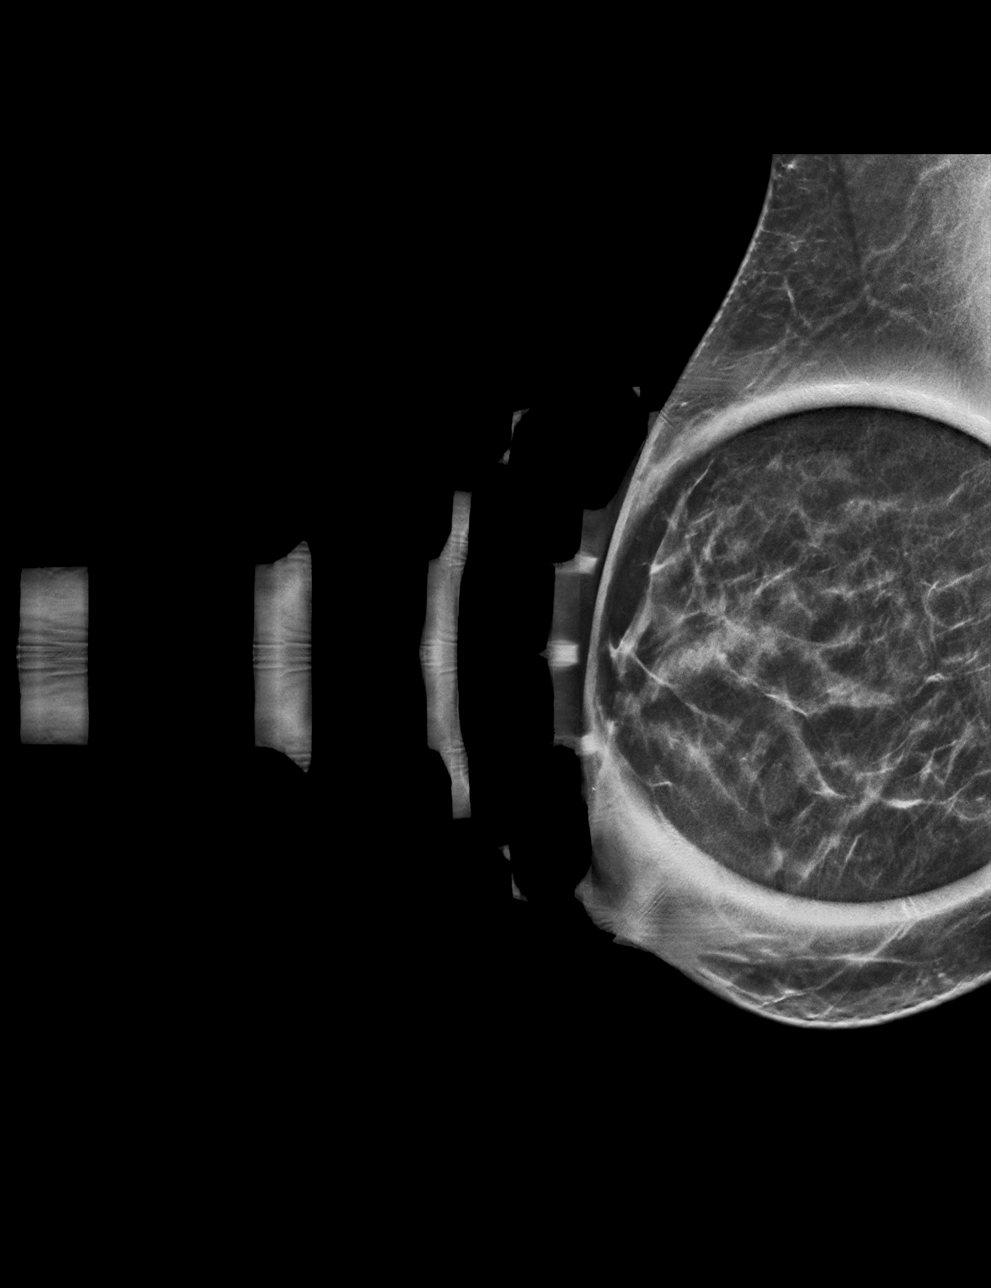

[R CC synth-2D]
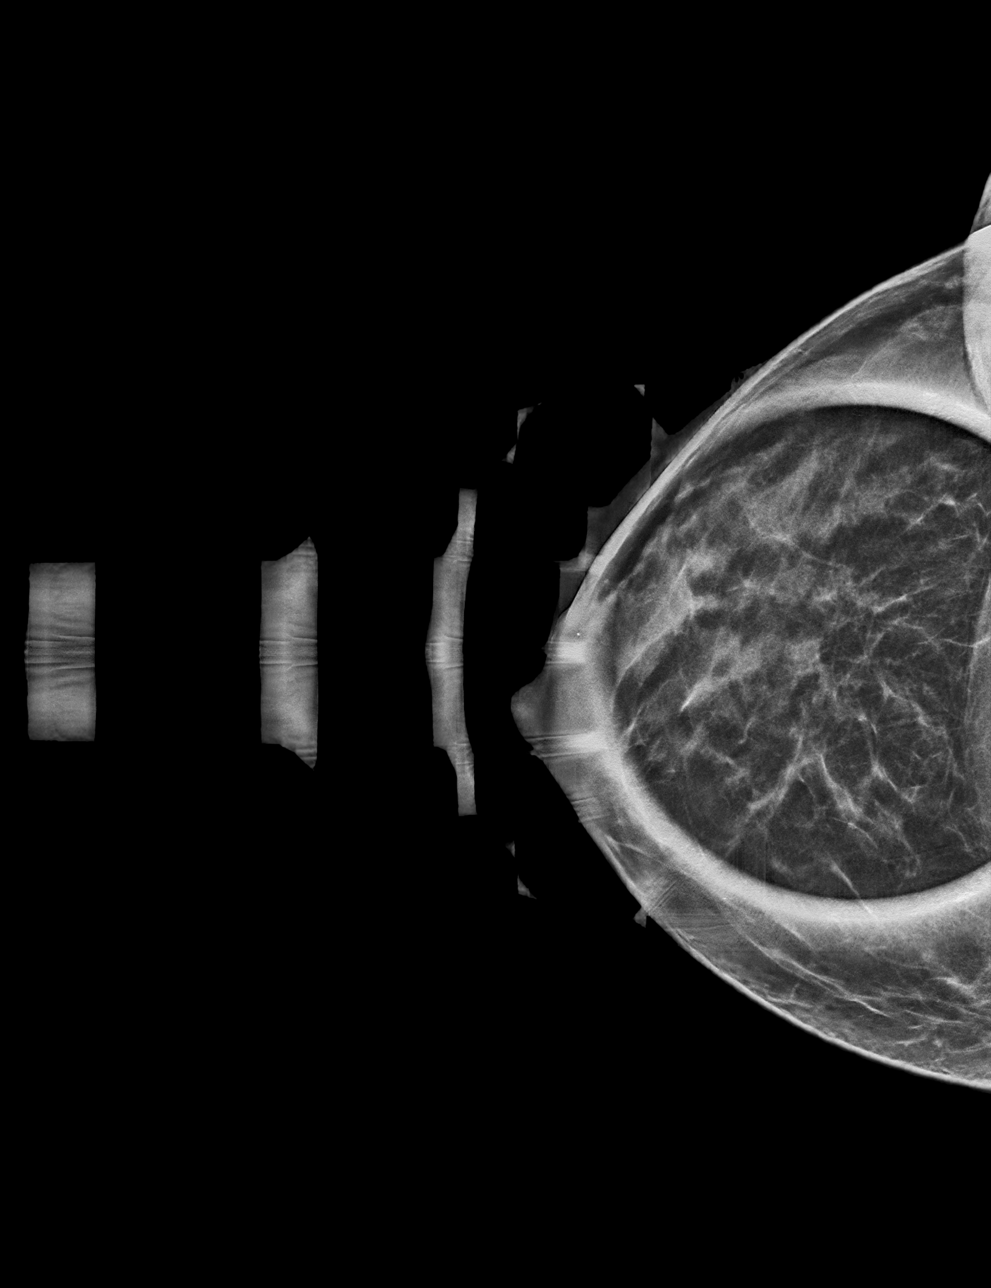

[R CC tomo · tomo slice 23/46.0]
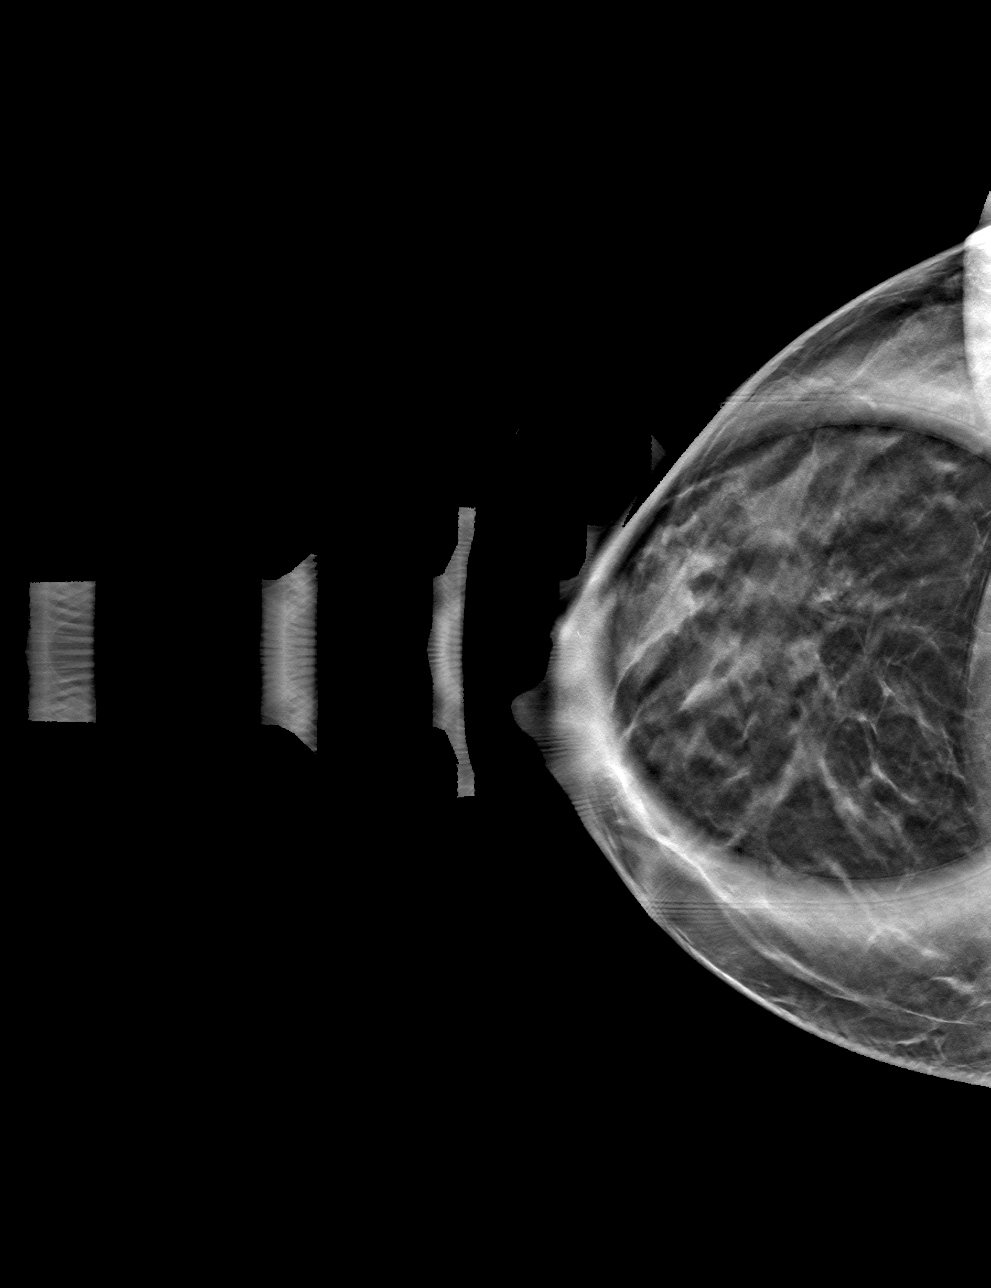

[R MLO tomo · tomo slice 25/49.0]
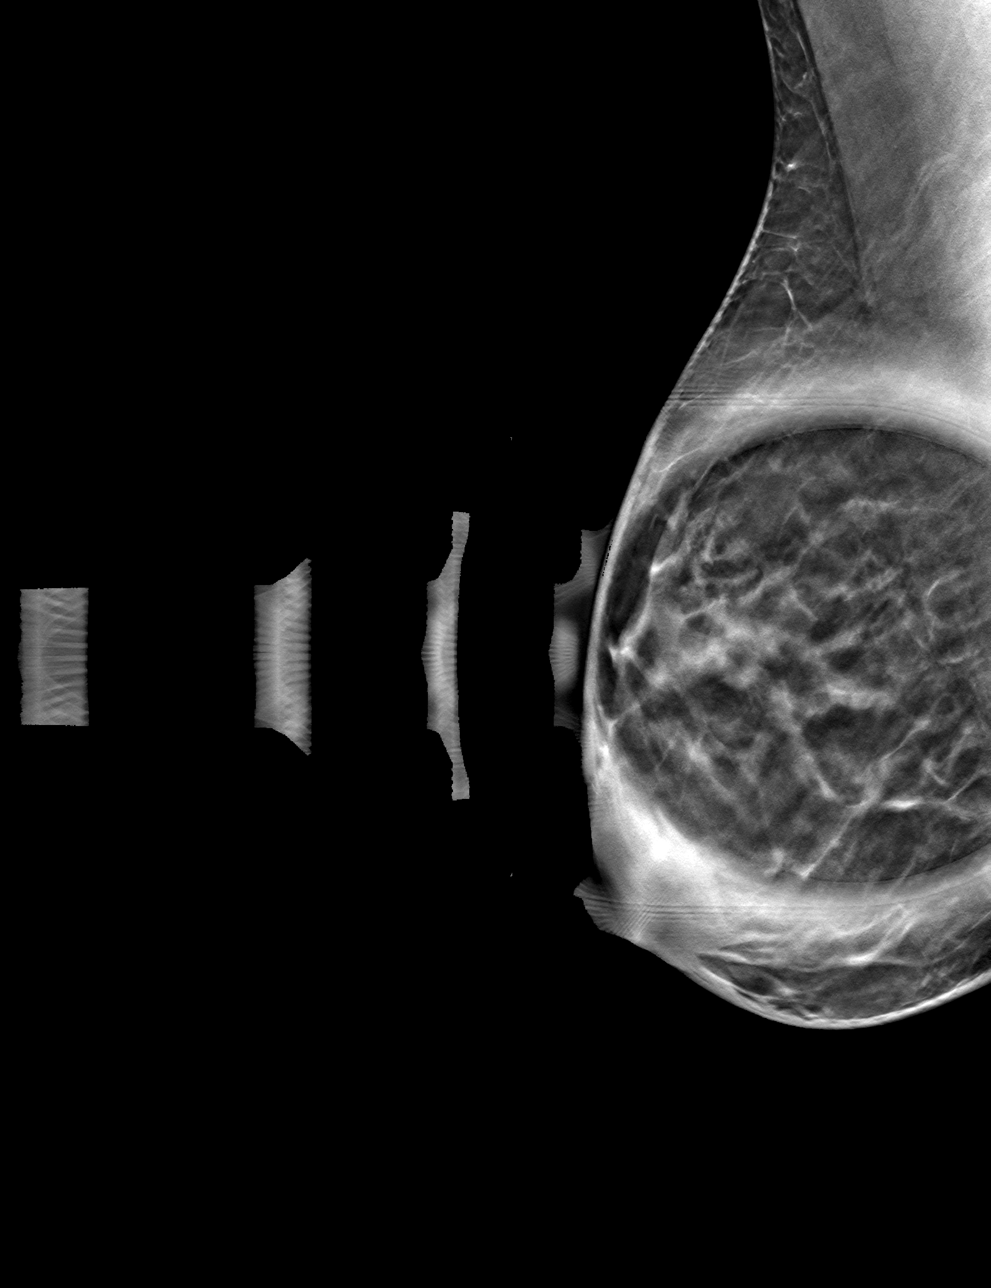

[4 of 12 positions shown; findings below may reference images not displayed]

ACR Breast Density Category c: The breast tissue is heterogeneously
dense, which may obscure small masses.
FINDINGS: 2D/3D spot compression views of the RIGHT breast demonstrate
dispersal of the screening study asymmetry. No persistent
abnormalities identified at the site of the screening study finding.

Based on the Tyrer-Cuzick risk assessment model, the patient has a
23% lifetime risk of developing breast cancer. By the
recommendations of the American Cancer Society, annual MRI and
annual mammography are recommended.
IMPRESSION: 1. No persistent abnormality at the site of the screening study
finding.
2. High lifetime risk for developing breast cancer (23%). Annual
screening mammogram and breast MRI recommended.

RECOMMENDATION:
Annual bilateral screening breast MR in this high risk patient.

Bilateral screening mammogram in 1 year.

Consider genetic testing as clinically indicated.

I have discussed the findings and recommendations with the patient.
If applicable, a reminder letter will be sent to the patient
regarding the next appointment.

BI-RADS CATEGORY  1: Negative.

## 2020-12-14 DIAGNOSIS — U099 Post covid-19 condition, unspecified: Secondary | ICD-10-CM | POA: Insufficient documentation

## 2021-01-12 ENCOUNTER — Telehealth: Payer: Self-pay | Admitting: Neurology

## 2021-01-12 MED ORDER — AMPHETAMINE-DEXTROAMPHETAMINE 5 MG PO TABS: 5.0000 mg | ORAL_TABLET | Freq: Every day | ORAL | 0 refills | Status: DC

## 2021-01-12 NOTE — Telephone Encounter (Signed)
Pt is asking Dr Anne Hahn calls in a small dose of Adderrall to [facility] [phone]  because she is tired all of the time

## 2021-01-12 NOTE — Telephone Encounter (Signed)
I called the patient, left a message.  I will call in a small prescription for the Adderall, the patient will need a revisit appointment for our office.

## 2021-01-12 NOTE — Addendum Note (Signed)
Addended by: York Spaniel on: 01/12/2021 12:59 PM   Modules accepted: Orders

## 2021-01-15 NOTE — Telephone Encounter (Signed)
Reached patient and she stated she isn't going to take anything right now, as she is feeling better.  She didn't want to make a follow up at this time but will call back to make one in the near future. Patient denied further questions, verbalized understanding and expressed appreciation for the phone call.

## 2021-01-25 NOTE — Telephone Encounter (Signed)
I called the patient.  The patient was questioning whether Vyvanse may be less addictive than the Adderall, the patient is only on 5 mg, she took 1 dose and felt that it made her feel much better during the day, she was concerned that she had some difficulty sleeping that evening.  The patient is on a short acting Adderall tablet, I would doubt that this would have any effect on her sleep if she took it early in the morning.  I prompted her to try the Adderall several more times and make a final decision after she has been on the medication a while.

## 2021-01-25 NOTE — Telephone Encounter (Signed)
Pt called, have a question about Adderall, would like to know the different between Adderal and extended Adderall. Is Zyanse less addictive? Would like a call from the nurse.

## 2021-03-29 ENCOUNTER — Inpatient Hospital Stay: Payer: 59

## 2021-03-29 ENCOUNTER — Inpatient Hospital Stay: Payer: 59 | Admitting: Genetic Counselor

## 2021-03-29 ENCOUNTER — Telehealth: Payer: Self-pay | Admitting: Genetic Counselor

## 2021-03-29 NOTE — Telephone Encounter (Signed)
Pt called to reschedule her genetic counseling appt to 5/26 at 11am.

## 2021-04-16 ENCOUNTER — Other Ambulatory Visit: Payer: Self-pay | Admitting: Obstetrics and Gynecology

## 2021-04-16 DIAGNOSIS — Z9189 Other specified personal risk factors, not elsewhere classified: Secondary | ICD-10-CM

## 2021-04-19 ENCOUNTER — Inpatient Hospital Stay: Payer: 59

## 2021-04-19 ENCOUNTER — Inpatient Hospital Stay: Payer: 59 | Attending: Genetic Counselor | Admitting: Genetic Counselor

## 2021-04-19 ENCOUNTER — Other Ambulatory Visit: Payer: Self-pay

## 2021-04-19 DIAGNOSIS — Z8042 Family history of malignant neoplasm of prostate: Secondary | ICD-10-CM

## 2021-04-19 DIAGNOSIS — Z803 Family history of malignant neoplasm of breast: Secondary | ICD-10-CM | POA: Diagnosis not present

## 2021-04-19 DIAGNOSIS — Z8481 Family history of carrier of genetic disease: Secondary | ICD-10-CM

## 2021-04-19 DIAGNOSIS — Z800 Family history of malignant neoplasm of digestive organs: Secondary | ICD-10-CM | POA: Diagnosis not present

## 2021-04-19 LAB — GENETIC SCREENING ORDER

## 2021-04-20 ENCOUNTER — Encounter: Payer: Self-pay | Admitting: Genetic Counselor

## 2021-04-20 DIAGNOSIS — Z8481 Family history of carrier of genetic disease: Secondary | ICD-10-CM | POA: Insufficient documentation

## 2021-04-20 DIAGNOSIS — Z803 Family history of malignant neoplasm of breast: Secondary | ICD-10-CM

## 2021-04-20 DIAGNOSIS — Z8042 Family history of malignant neoplasm of prostate: Secondary | ICD-10-CM

## 2021-04-20 DIAGNOSIS — Z800 Family history of malignant neoplasm of digestive organs: Secondary | ICD-10-CM | POA: Insufficient documentation

## 2021-04-20 HISTORY — DX: Family history of malignant neoplasm of digestive organs: Z80.0

## 2021-04-20 HISTORY — DX: Family history of malignant neoplasm of prostate: Z80.42

## 2021-04-20 HISTORY — DX: Family history of malignant neoplasm of breast: Z80.3

## 2021-04-20 HISTORY — DX: Family history of carrier of genetic disease: Z84.81

## 2021-04-20 NOTE — Progress Notes (Signed)
REFERRING PROVIDER: No referring provider defined for this encounter.  PRIMARY PROVIDER:  Chesley Noon, MD  PRIMARY REASON FOR VISIT:  1. Family history of gene mutation in CHEK2 and APC   2. Family history of breast cancer   3. Family history of prostate cancer   4. Family history of colon cancer     HISTORY OF PRESENT ILLNESS:   Jeanette Rice, a 35 y.o. female, was seen for a [place] cancer genetics consultation due to a family history of cancer and a paternal family history of known hereditary cancer mutations in [place] and APC.  Jeanette Rice presents to clinic today to discuss the possibility of a hereditary predisposition to cancer, to discuss genetic testing, and to further clarify her future cancer risks, as well as potential cancer risks for family members.   Jeanette Rice is a 35 y.o. female with no personal history of cancer.  Jeanette Rice was previously found to have a Tyrer Cuzick score greater than 20%, which places her at higher risk for breast cancer compared to the general population.  She has plans to begin annual breast MRIs in addition to annual mammograms.   CANCER HISTORY:  Oncology History   No history exists.   RISK FACTORS:  Menarche was at age 8.  First live birth at age 55.  OCP use for approximately 10 years.  Ovaries intact: yes.  Hysterectomy: no.  Menopausal status: premenopausal.  HRT use: 0 years. Colonoscopy: no; not examined. Mammogram within the last year: yes; breast MRI scheduled 04/2021 Number of breast biopsies: 0. Up to date with pelvic exams: yes. Any excessive radiation exposure in the past: no  Past Medical History:  Diagnosis Date  . COVID-19 virus infection    July 2020  . Family history of breast cancer 04/20/2021  . Family history of colon cancer 04/20/2021  . Family history of gene mutation in [place] and APC 04/20/2021  . Family history of prostate cancer 04/20/2021    Past Surgical History:  Procedure Laterality Date  . WISDOM  TOOTH EXTRACTION      Social History   Socioeconomic History  . Marital status: Married    Spouse name: Not on file  . Number of children: 2  . Years of education: college  . Highest education level: Not on file  Occupational History  . 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 Smoking status: Never Smoker  . Smokeless tobacco: Never Used  Substance and Sexual Activity  . Alcohol use: No  . Drug use: No  . Sexual activity: Yes    Partners: Male  Other Topics Concern  . Not on file  Social History Narrative   She is originally from the [place] area and moved to [facility] for her husband's job.   Lives at home with family.-Husband and 2 children ages 25 and 1.   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   Leftt-handed.   1 cup caffeine per day.   Social Determinants of Health   Financial Resource Strain: Not on file  Food Insecurity: Not on file  Transportation Needs: Not on file  Physical Activity: Not on file  Stress: Not on file  Social Connections: Not on file     FAMILY HISTORY:  We obtained a detailed, 4-generation family history.  Significant diagnoses are listed below: Family History  Problem Relation Age of Onset  . Breast cancer Mother        DCIS; dx 51s  .  Other Father        mutations in CHEK2 and APC genes  . Breast cancer Maternal Grandmother        dx 80s  . Colon cancer Maternal Grandmother 41  . Prostate cancer Paternal Grandfather        dx 71s  . Prostate cancer Other        mets? d. 44     Jeanette Rice has two sons, ages 14 and 55.  She has no siblings.    Her mother was diagnosed with DCIS in her 54s.  She had genetic testing through [facility] in 2017 and was negative.  Jeanette Rice mother was found to have a variant of uncertain significance in the [facility] gene called c.[id number]>A (p.Arg565His). The [facility] gene panel offered by [employer] includes sequencing and deletion/duplication testing of the following 35  genes: APC, ATM, AXIN2, BARD1, BMPR1A, BRCA1, BRCA2, BRIP1, CHD1, CDK4, CDKN2A, CHEK2, EPCAM (large rearrangement only), HOXB13, (sequencing only), GALNT12, MLH1, MSH2, MSH3 (excluding repetitive portions of exon 1), MSH6, MUTYH, NBN, NTHL1, PALB2, PMS2, PTEN, RAD51C, RAD51D, RNF43, RPS20, SMAD4, STK11, and TP53. Sequencing was performed for select regions of POLE and POLD1, and large rearrangement analysis was performed for select regions of GREM1.  Jeanette Rice maternal grandmother had a history of colon cancer in her mid 19s and breast cancer in her 38s.   Jeanette Rice father is 43 and has had an unknown number of colon polyps.  Jeanette Rice father had genetic testing through [facility] in 2020 for the following genes:   APC, ATM, AXIN2, BARD1, BMPR1A, BRCA1, BRCA2, BRIP1, CDH1, CDKN2A, CHEK2, MLH1, MSH2, MSH3, MSH6, MUTYH, NBN, NF1, NTHL1, PALB2, PMS2, RAD51C, RAD51D, SMAD4, STK11, TP53 (sequencing and deletion/duplication); HOXB12, POLD1, POLE (sequencing only), and EPCAM and GREM1 (deletion/duplication only).  Jeanette Rice father was found to have a pathogenic variant in the CHEK2 gene called p.S428F and a moderate risk mutation in the APC gene caled p.[id number].  Jeanette Rice paternal grandfather had a history of prostate cancer around age 67 and also a history of leukemia.  Jeanette Rice paternal grandfather's brother had prostate cancer, possibly metastatic.    Jeanette Rice is unaware of previous family history of genetic testing for hereditary cancer risks besides that mentioned above. There is reported Ashkenazi Jewish ancestry. There is no known consanguinity.  GENETIC COUNSELING ASSESSMENT: Jeanette Rice is a 35 y.o. female with a family history of cancer, Jewish ancestry, and a family history of hereditary cancer mutations, which is suggestive of a predisposition to cancer. We, therefore, discussed and recommended the following at today's visit.   DISCUSSION: We discussed that 5 - 10% of cancer is  hereditary   We discussed that given her father has mutations in the CHEK2 and APC genes, she has a 50% chance of also having the same familial mutations.  We briefly discussed colon and breast cancer risks for those with CHEK2 mutations and moderate colon cancer risks for the specific APC variant detected in her father.  There are other genes that can be associated with hereditary breast, prostate, and colon cancer syndromes.  Type of cancer risk and level of risk are gene-specific.  We discussed that testing is beneficial for several reasons, including knowing about other cancer risks, identifying potential screening and risk-reduction options that may be appropriate, and to understanding if other family members could be at risk for cancer and allowing them to undergo genetic testing.  We reviewed the characteristics, features and inheritance patterns  of hereditary cancer syndromes. We also discussed genetic testing, including the appropriate family members to test, the process of testing, insurance coverage and turn-around-time for results. We discussed the implications of a negative, positive, carrier and/or variant of uncertain significant result.  We recommended Jeanette Rice pursue genetic testing for a panel that contains the CHEK2 and APC genes.   Jeanette Rice was offered a common hereditary cancer panel (47 genes) and an expanded pan-cancer panel (77 genes). Jeanette Rice was informed of the benefits and limitations of each panel, including that expanded pan-cancer panels contain several genes that do not have clear management guidelines at this point in time.  We also discussed that as the number of genes included on a panel increases, the chances of variants of uncertain significance increases.  After considering the benefits and limitations of each gene panel, Jeanette Rice elected to have an expanded pan-cancer panel through [facility].  The CancerNext-Expanded gene panel offered by [facility] and includes  sequencing, rearrangement, and RNA analysis for the following 77 genes: AIP, ALK, APC, ATM, AXIN2, BAP1, BARD1, BLM, BMPR1A, BRCA1, BRCA2, BRIP1, CDC73, CDH1, CDK4, CDKN1B, CDKN2A, CHEK2, CTNNA1, DICER1, FANCC, FH, FLCN, GALNT12, KIF1B, LZTR1, MAX, MEN1, MET, MLH1, MSH2, MSH3, MSH6, MUTYH, NBN, NF1, NF2, NTHL1, PALB2, PHOX2B, PMS2, POT1, PRKAR1A, PTCH1, PTEN, RAD51C, RAD51D, RB1, RECQL, RET, SDHA, SDHAF2, SDHB, SDHC, SDHD, SMAD4, SMARCA4, SMARCB1, SMARCE1, STK11, SUFU, TMEM127, TP53, TSC1, TSC2, VHL and XRCC2 (sequencing and deletion/duplication); EGFR, EGLN1, HOXB13, KIT, MITF, PDGFRA, POLD1, and POLE (sequencing only); EPCAM and GREM1 (deletion/duplication only).    Based on Jeanette Rice. [place] family history of cancer, Jewish ancestry, and family history of known hereditary cancer mutations, she meets medical criteria for genetic testing. Despite that she meets criteria, she may still have an out of pocket cost. We discussed that if her out of pocket cost for testing is over $100, the laboratory should contact her to discuss self-pay options or patient pay assistance programs.   We discussed that some people do not want to undergo genetic testing due to fear of genetic discrimination.  A federal law called the Genetic Information Non-Discrimination Act (GINA) of 2008 helps protect individuals against genetic discrimination based on their genetic test results.  It impacts both health insurance and employment.  With health insurance, it protects against increased premiums, being kicked off insurance or being forced to take a test in order to be insured.  For employment it protects against hiring, firing and promoting decisions based on genetic test results.  GINA does not apply to those in the [employer], those who work for companies with less than 15 employees, and new life insurance or long-term disability insurance policies.  Health status due to a cancer diagnosis is not protected under GINA.  PLAN: After  considering the risks, benefits, and limitations, Jeanette Rice. Constantin provided informed consent to pursue genetic testing and the blood sample was sent to [facility] for analysis of the CancerNext-Expanded +RNAinsight Panel. Results should be available within approximately 3 weeks' time, at which point they will be disclosed by telephone to Jeanette Rice. [place], as will any additional recommendations warranted by these results. Jeanette Rice. Konczal will receive a summary of her genetic counseling visit and a copy of her results once available. This information will also be available in Epic.   Lastly, we encouraged Jeanette Rice. Jonsson to remain in contact with cancer genetics annually so that we can continuously update the family history and inform her of any changes in cancer genetics and testing that may  be of benefit for this family.   Jeanette Rice. [place] questions were answered to her satisfaction today. Our contact information was provided should additional questions or concerns arise. Thank you for the referral and allowing [place] to share in the care of your patient.   Yannis Gumbs M. Joette Catching, [place], [facility] Genetic Counselor [email or web address] (P) [phone]   The patient was seen for a total of 35 minutes in face-to-face genetic counseling.  Drs. Magrinat, Lindi Adie and/or Burr Medico were available to discuss this case as needed.  _______________________________________________________________________ For Office Staff:  Number of people involved in session: 1 Was an Intern/ student involved with case: yes

## 2021-05-08 ENCOUNTER — Other Ambulatory Visit: Payer: Self-pay

## 2021-05-08 ENCOUNTER — Ambulatory Visit
Admission: RE | Admit: 2021-05-08 | Discharge: 2021-05-08 | Disposition: A | Discharge status: Home / Self Care | Payer: 59 | Source: Ambulatory Visit | Attending: Obstetrics and Gynecology | Admitting: Obstetrics and Gynecology

## 2021-05-08 DIAGNOSIS — Z9189 Other specified personal risk factors, not elsewhere classified: Secondary | ICD-10-CM

## 2021-05-08 IMAGING — MR MR BREAST BILAT WO/W CM
8 of 12 series · 33 of 48 positions shown · IV contrast (7 ml gadavist)
Comparison: Previous exam(s).

CLINICAL DATA: The patient has a lifetime risk of breast cancer of
23%. High risk patient.

LABS:  None
EXAM:
BILATERAL BREAST MRI WITH AND WITHOUT CONTRAST
TECHNIQUE: Multiplanar, multisequence MR images of both breasts were obtained
prior to and following the intravenous administration of 7 ml of
Gadavist

[Series 2: t2_tirm_tra ipat (a-p) · axial · 3.0mm · 0.70mm/px · 1 of 55 slices shown]
[im 1/55]
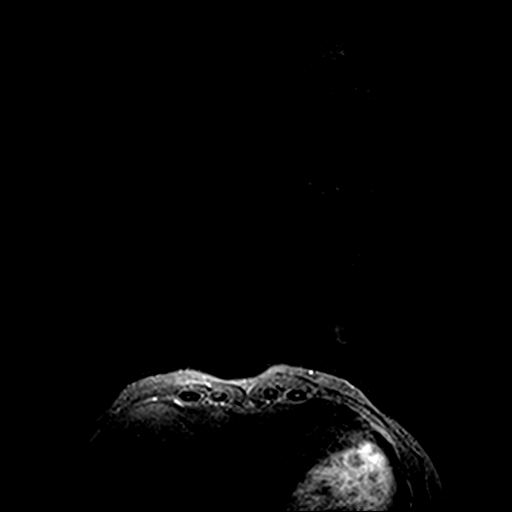

[Series 3: fl3d pre-cm no · axial · non-contrast · 1.2mm · 0.94mm/px · z∈[-75,+96]mm · 5 of 144 slices shown]
[im 1/144]
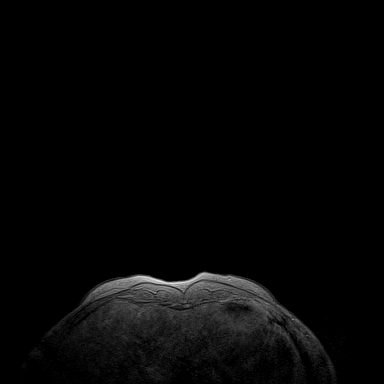
[im 36/144]
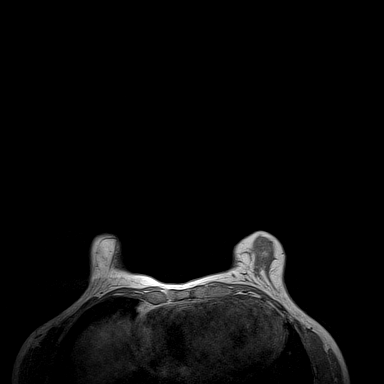
[im 72/144]
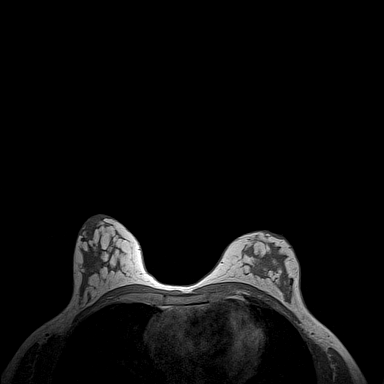
[im 108/144]
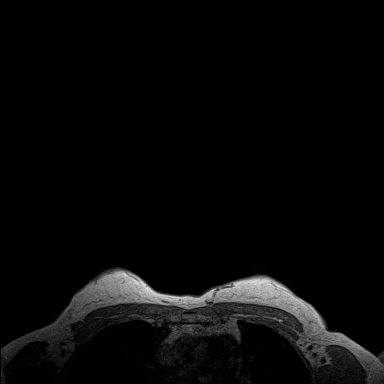
[im 144/144]
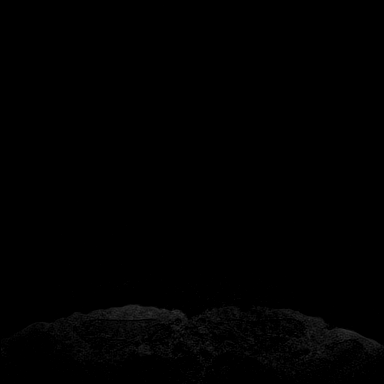

[Series 4: fl3d pre-cm · axial · non-contrast · 1.2mm · 0.94mm/px · z∈[-75,+96]mm · 5 of 144 slices shown]
[im 1/144]
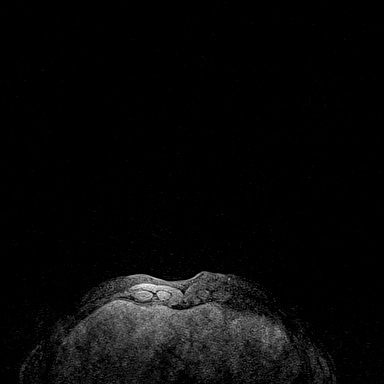
[im 36/144]
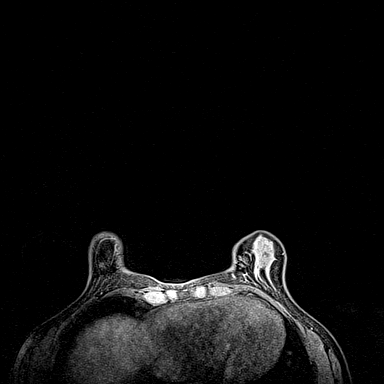
[im 72/144]
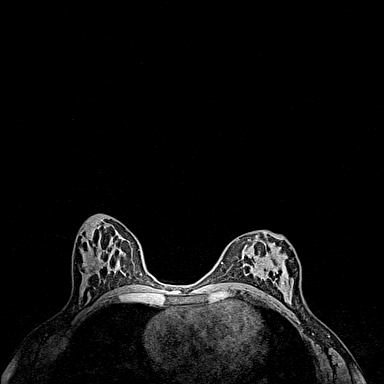
[im 108/144]
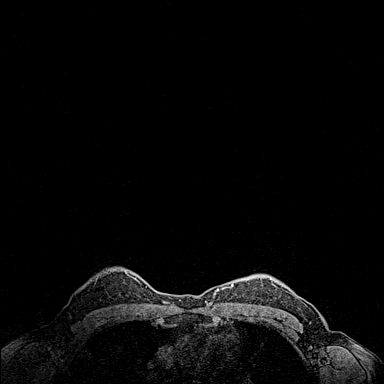
[im 144/144]
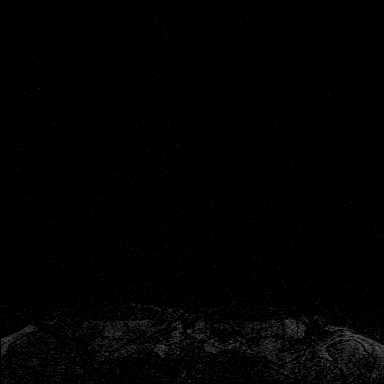

[Series 5: fl3d post-cm 20 · axial · 1.2mm · 0.94mm/px · z∈[-75,+96]mm · 5 of 144 slices shown (1 of 3)]
[im 1/144]
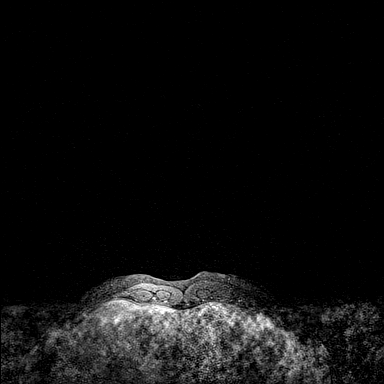
[im 36/144]
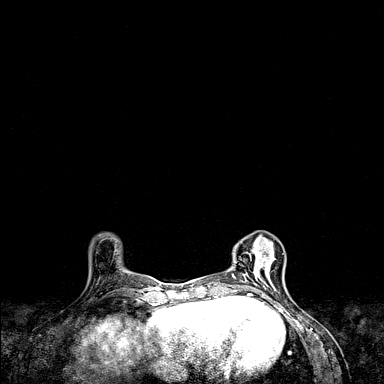
[im 72/144]
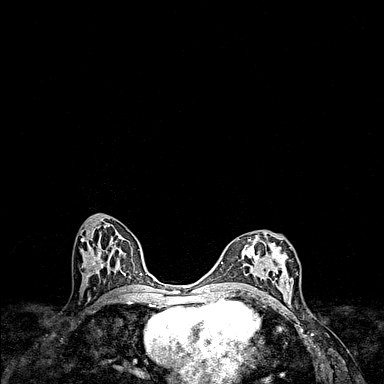
[im 108/144]
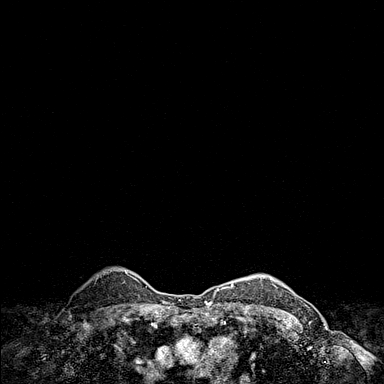
[im 144/144]
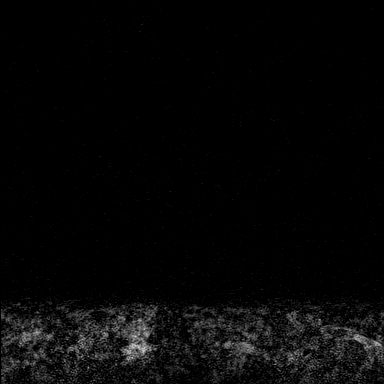

[Series 6: fl3d post-cm 20 · axial · 1.2mm · 0.94mm/px · z∈[-75,+96]mm · 5 of 144 slices shown (2 of 3)]
[im 1/144]
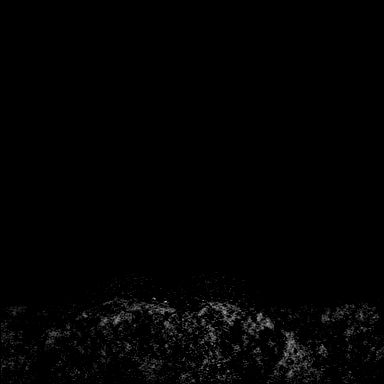
[im 36/144]
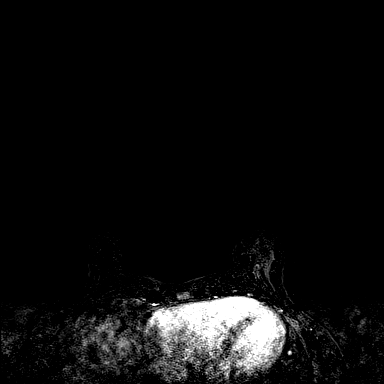
[im 72/144]
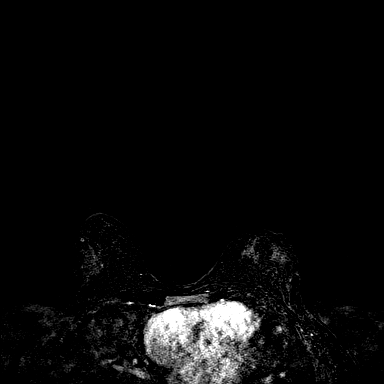
[im 108/144]
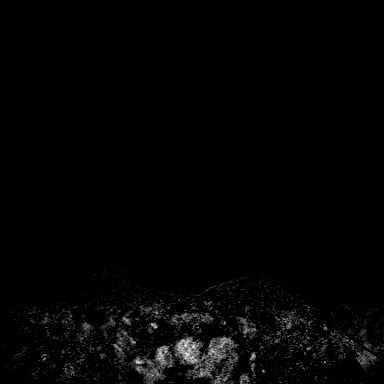
[im 144/144]
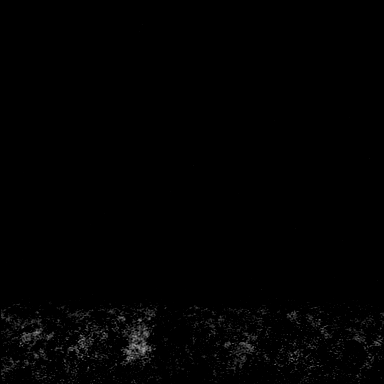

[Series 7: fl3d post-cm 20 · axial · 172.8mm · 0.94mm/px · 1 of 1 slices shown (3 of 3)]
[im 1/1]
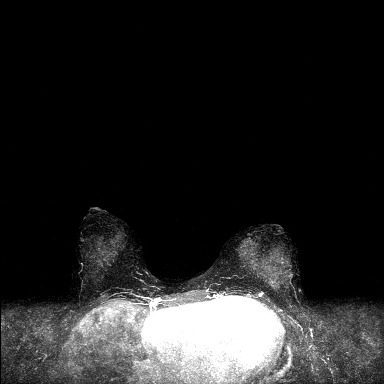

[Series 8: fl3d post-cm 3min · axial · 1.2mm · 0.94mm/px · z∈[-75,+96]mm · 6 of 144 slices shown]
[im 1/144]
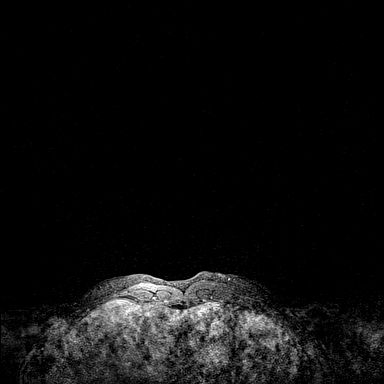
[im 29/144]
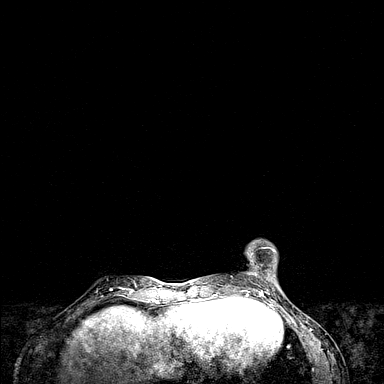
[im 58/144]
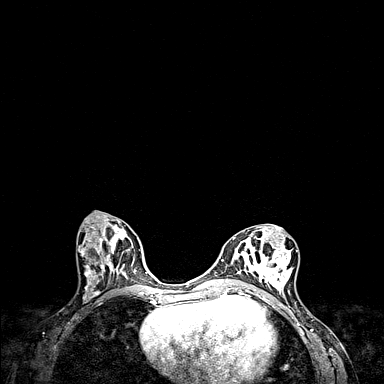
[im 86/144]
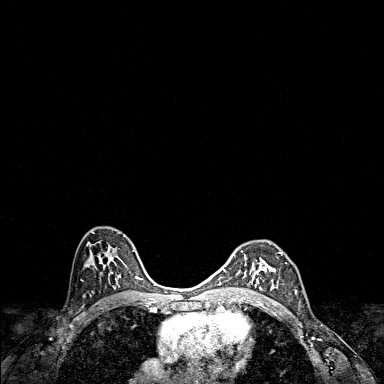
[im 115/144]
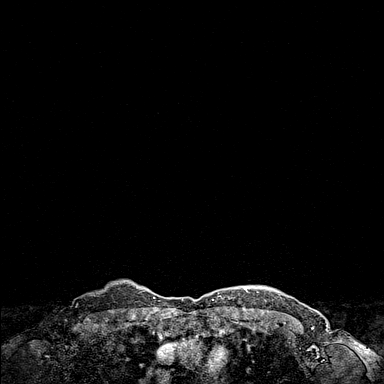
[im 144/144]
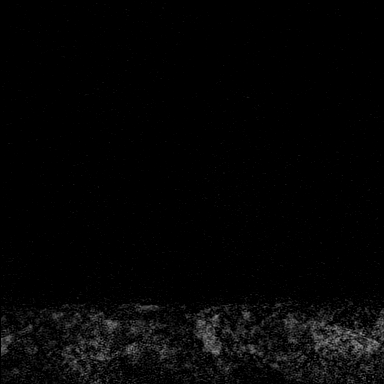

[Series 9: fl3d post-cm 3min_sub · axial · 1.2mm · 0.94mm/px · z∈[-75,+61]mm · 5 of 144 slices shown]
[im 1/144]
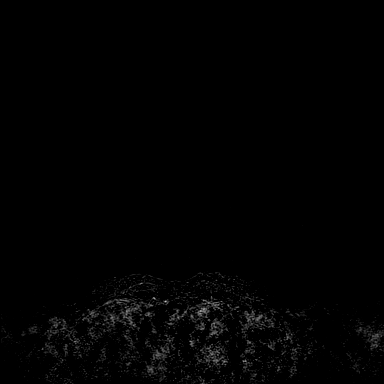
[im 29/144]
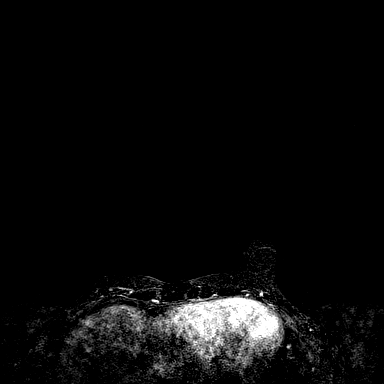
[im 58/144]
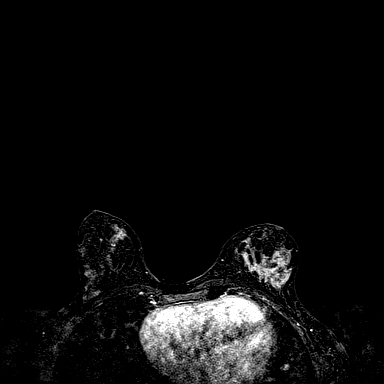
[im 86/144]
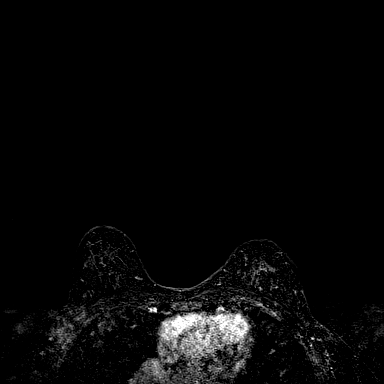
[im 115/144]
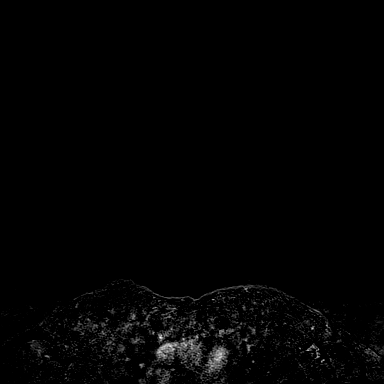

[33 of 48 positions shown; findings below may reference images not displayed]

Three-dimensional MR images were rendered by post-processing of the
original MR data on an independent workstation. The
three-dimensional MR images were interpreted, and findings are
reported in the following complete MRI report for this study. Three
dimensional images were evaluated at the independent interpreting
workstation using the DynaCAD thin client.
FINDINGS: Breast composition: c. Heterogeneous fibroglandular tissue.

Background parenchymal enhancement: Marked

Right breast: There is a new 4 mm mass in the slightly upper outer
right breast well seen on series 9, image 73 with ring-like
enhancement. The mass demonstrates a persistent enhancement pattern.
The mass is T2 bright. No other suspicious findings are seen in the
right breast.

Left breast: No mass or abnormal enhancement.

Lymph nodes: No abnormal appearing lymph nodes.

Ancillary findings:  None.
IMPRESSION: 1. There is a new 4 mm mass in the upper outer right breast well
seen on series 9, image 73 with a persistent ring like enhancement
pattern and high T2 signal.
2. No other evidence of malignancy in either breast.

RECOMMENDATION:
Recommend MRI guided biopsy of the new 4 mm mass in the upper outer
right breast.

BI-RADS CATEGORY  4: Suspicious.

## 2021-05-08 MED ORDER — GADOBENATE DIMEGLUMINE 529 MG/ML IV SOLN
15.0000 mL | Freq: Once | INTRAVENOUS | Status: AC | PRN
  Administered 2021-05-08: 15 mL via INTRAVENOUS

## 2021-05-09 ENCOUNTER — Other Ambulatory Visit: Payer: Self-pay | Admitting: Obstetrics and Gynecology

## 2021-05-09 ENCOUNTER — Telehealth: Payer: Self-pay | Admitting: Genetic Counselor

## 2021-05-09 DIAGNOSIS — R9389 Abnormal findings on diagnostic imaging of other specified body structures: Secondary | ICD-10-CM

## 2021-05-09 NOTE — Telephone Encounter (Signed)
Received message that Ms. Creely called to inquire about status of genetic testing results.  Returned call and LVM that results are not yet available and are expected to return on 05/11/2021.

## 2021-05-10 ENCOUNTER — Ambulatory Visit
Admission: RE | Admit: 2021-05-10 | Discharge: 2021-05-10 | Disposition: A | Discharge status: Home / Self Care | Payer: 59 | Source: Ambulatory Visit | Attending: Obstetrics and Gynecology | Admitting: Obstetrics and Gynecology

## 2021-05-10 ENCOUNTER — Other Ambulatory Visit: Payer: Self-pay | Admitting: Obstetrics and Gynecology

## 2021-05-10 ENCOUNTER — Other Ambulatory Visit: Payer: Self-pay

## 2021-05-10 ENCOUNTER — Other Ambulatory Visit: Payer: Self-pay | Admitting: Body Imaging

## 2021-05-10 DIAGNOSIS — R9389 Abnormal findings on diagnostic imaging of other specified body structures: Secondary | ICD-10-CM

## 2021-05-10 DIAGNOSIS — Z9189 Other specified personal risk factors, not elsewhere classified: Secondary | ICD-10-CM

## 2021-05-10 IMAGING — MR MR BREAST BX W/ LOC DEV 1ST LEASION IMAGE BX SPEC MR GUIDE*R*
7 of 10 series · 33 of 48 positions shown · IV contrast (6ml gadavist)
Comparison: Previous exams.
COMPARISON: Previous exams.

Addendum:
CLINICAL DATA: 35-year-old female presenting for MRI guided biopsy
of a 4 mm right breast mass.

EXAM:
MRI GUIDED CORE NEEDLE BIOPSY OF THE RIGHT BREAST
TECHNIQUE: Multiplanar, multisequence MR imaging of the right breast was
performed both before and after administration of intravenous
contrast.

[Series 2: fiducial unilateral · sagittal · 2.0mm · 1.33mm/px · 3 of 52 slices shown]
[im 1/52]
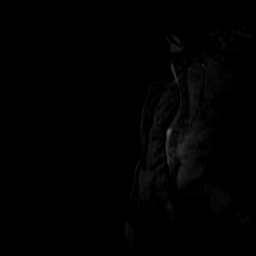
[im 26/52]
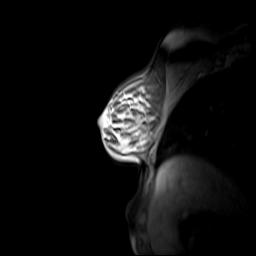
[im 52/52]
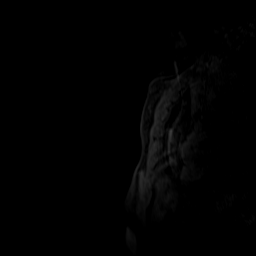

[Series 3: dynamic pre · axial · non-contrast · 1.3mm · 0.73mm/px · z∈[-92,+93]mm · 5 of 144 slices shown]
[im 1/144]
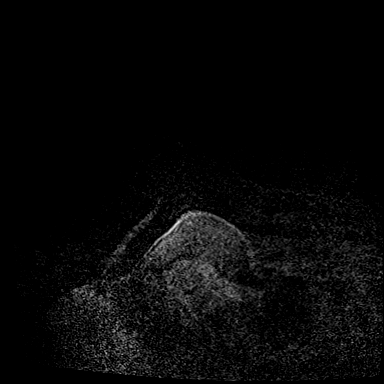
[im 36/144]
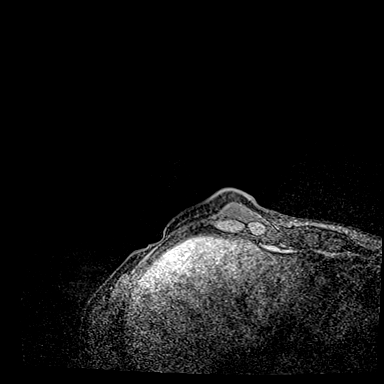
[im 72/144]
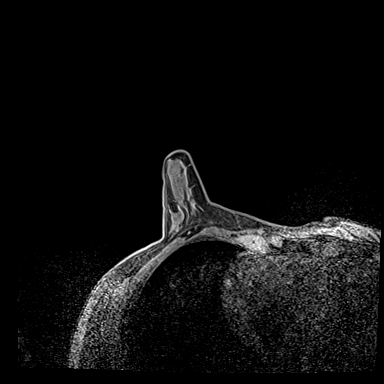
[im 108/144]
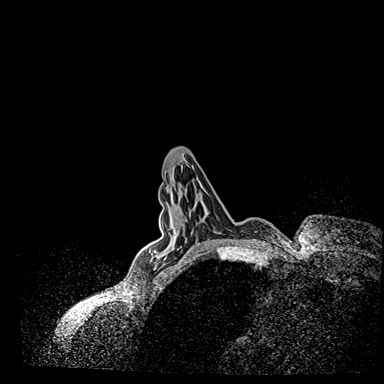
[im 144/144]
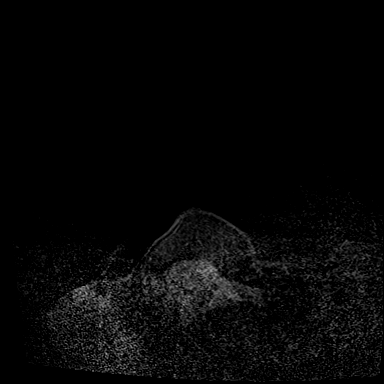

[Series 4: dynamic post 20 · axial · 1.3mm · 0.73mm/px · z∈[-92,+93]mm · 5 of 144 slices shown (1 of 2)]
[im 1/144]
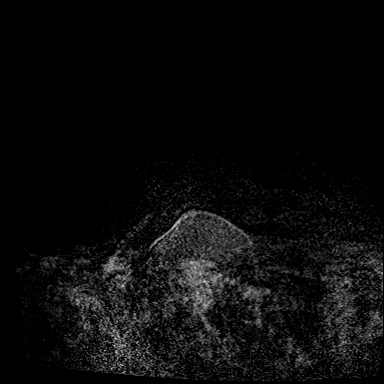
[im 36/144]
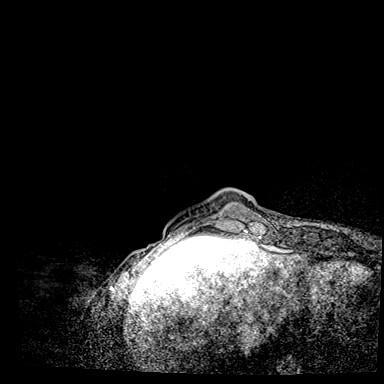
[im 72/144]
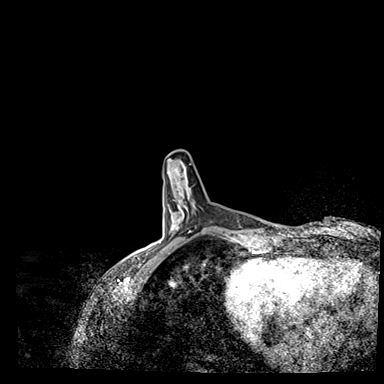
[im 108/144]
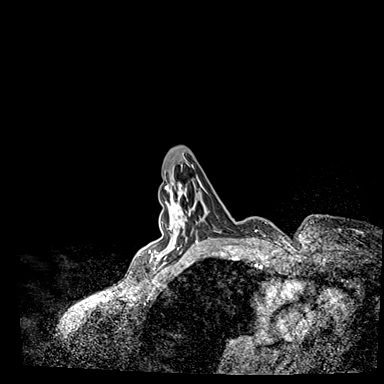
[im 144/144]
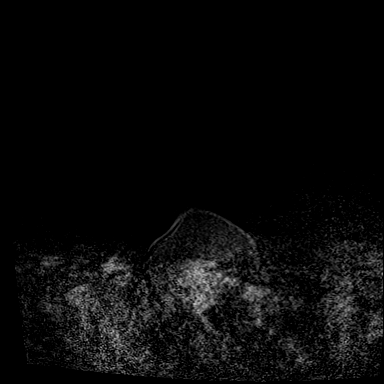

[Series 5: dynamic post 20 · axial · 1.3mm · 0.73mm/px · z∈[-92,+93]mm · 5 of 144 slices shown (2 of 2)]
[im 1/144]
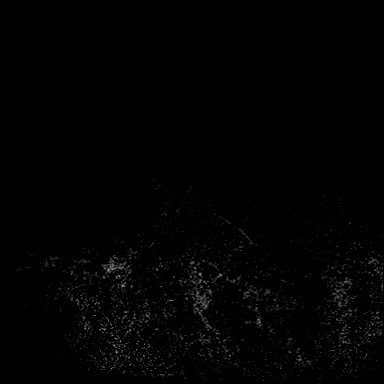
[im 36/144]
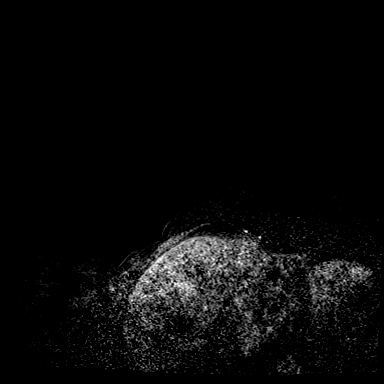
[im 72/144]
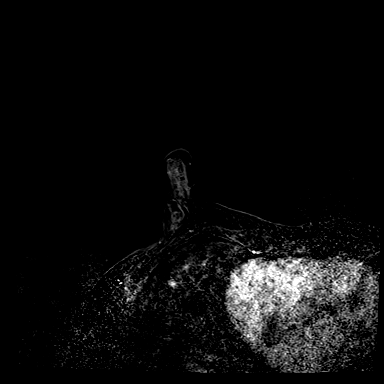
[im 108/144]
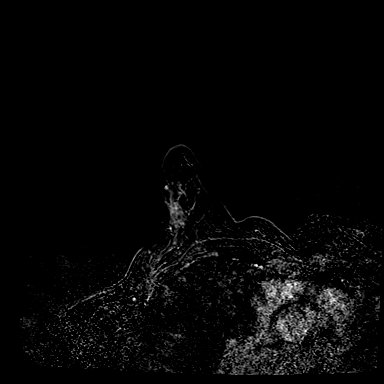
[im 144/144]
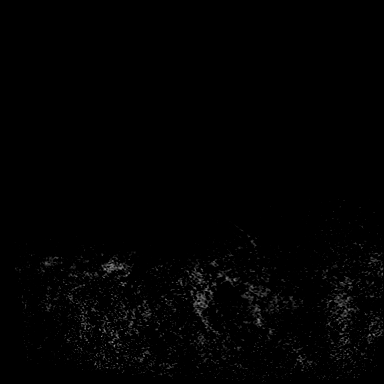

[Series 6: dynamic post 3 · axial · 1.3mm · 0.73mm/px · z∈[-92,+93]mm · 5 of 144 slices shown (1 of 2)]
[im 1/144]
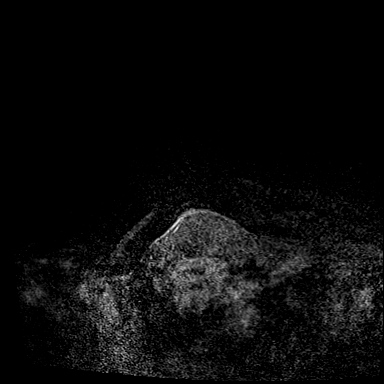
[im 36/144]
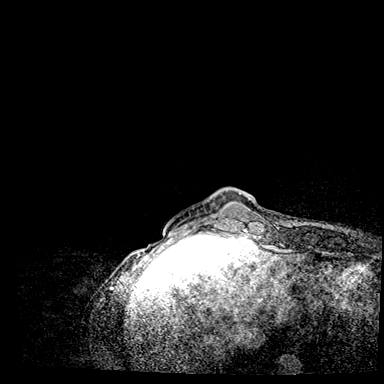
[im 72/144]
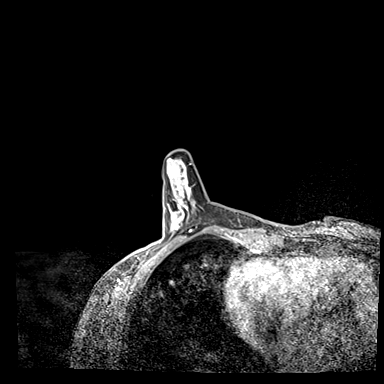
[im 108/144]
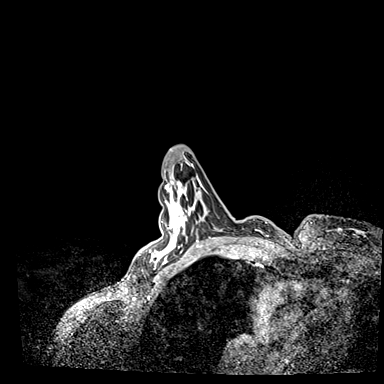
[im 144/144]
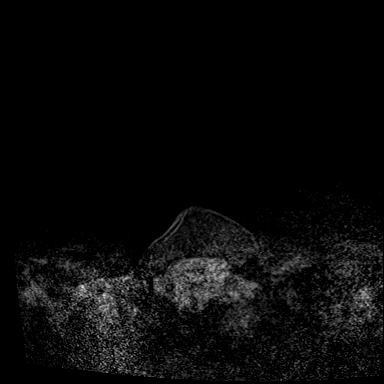

[Series 7: dynamic post 3 · axial · 1.3mm · 0.73mm/px · z∈[-92,+93]mm · 5 of 144 slices shown (2 of 2)]
[im 1/144]
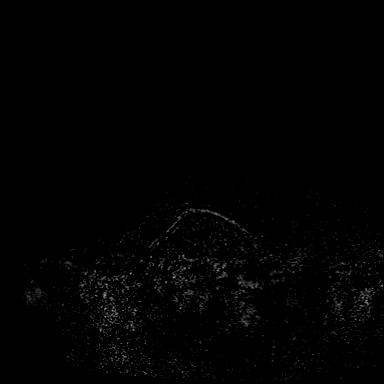
[im 36/144]
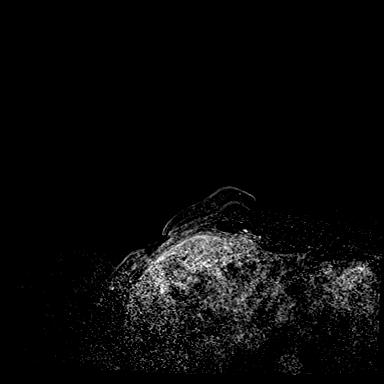
[im 72/144]
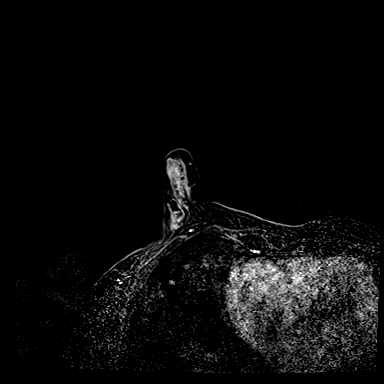
[im 108/144]
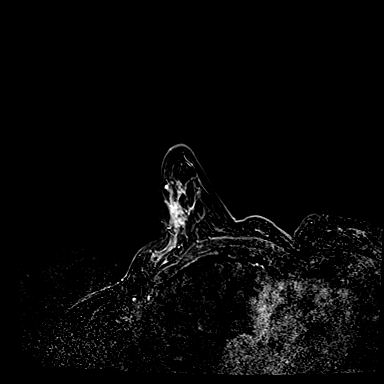
[im 144/144]
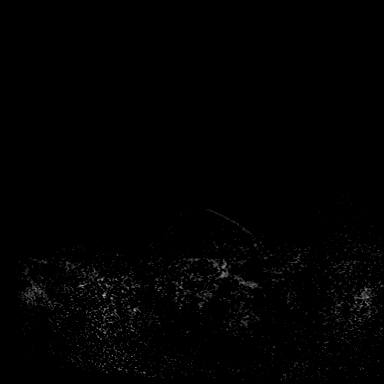

[Series 8: needle confirmation · axial · 1.3mm · 0.73mm/px · z∈[-92,+93]mm · 5 of 144 slices shown]
[im 1/144]
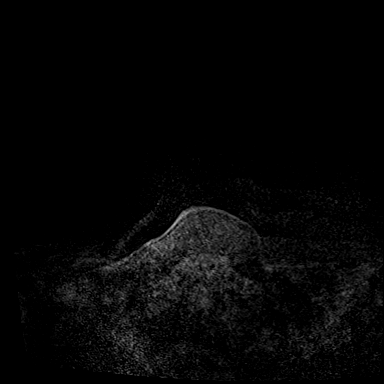
[im 36/144]
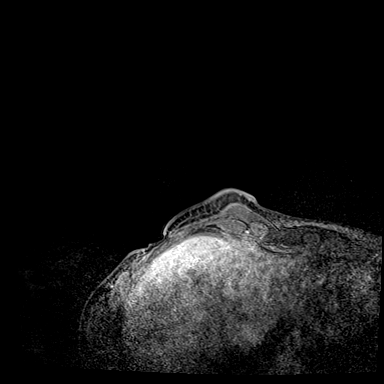
[im 72/144]
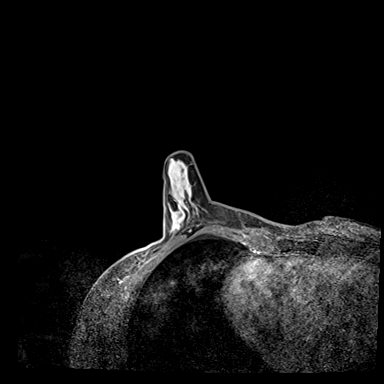
[im 108/144]
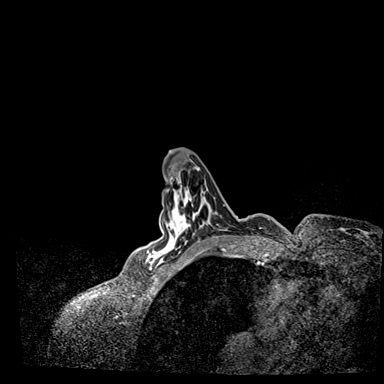
[im 144/144]
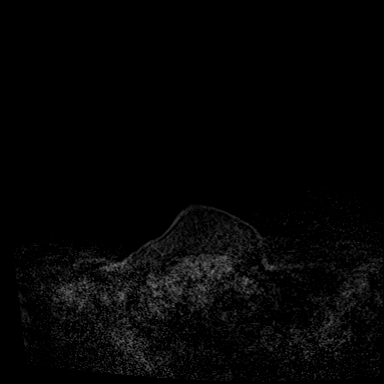

[33 of 48 positions shown; findings below may reference images not displayed]

FINDINGS: I met with the patient, and we discussed the procedure of MRI guided
biopsy, including risks, benefits, and alternatives. Specifically,
we discussed the risks of infection, bleeding, tissue injury, clip
migration, and inadequate sampling. Informed, written consent was
given. The usual time out protocol was performed immediately prior
to the procedure.

Using sterile technique, 1% Lidocaine, MRI guidance, and a 9 gauge
vacuum assisted device, biopsy was performed of a mass in the
upper-outer right breast using a lateral approach. At the conclusion
of the procedure, a barbell shaped tissue marker clip was deployed
into the biopsy cavity. Follow-up 2-view mammogram was performed and
dictated separately.
IMPRESSION: MRI guided biopsy of a 4 mm mass in the upper-outer right breast. No
apparent complications.

ADDENDUM:
Pathology revealed FIBROADENOMA, PSEUDOANGIOMATOUS STROMAL
HYPERPLASIA (PASH) of the Right breast, upper outer (barbell clip).
This was found to be concordant by Dr. TOYOKO.

Pathology results were discussed with the patient by telephone. The
patient reported doing well after the biopsy with tenderness at the
site. Post biopsy instructions and care were reviewed and questions
were answered. The patient was encouraged to call The [REDACTED] for any additional concerns. My direct phone
number was provided.

The patient was instructed to return for a bilateral breast MRI in 6
months, per protocol, and to continue with annual screening
mammograms.

Pathology results reported by TOYOKO, RN on [DATE].

*** End of Addendum ***
FINDINGS: I met with the patient, and we discussed the procedure of MRI guided
biopsy, including risks, benefits, and alternatives. Specifically,
we discussed the risks of infection, bleeding, tissue injury, clip
migration, and inadequate sampling. Informed, written consent was
given. The usual time out protocol was performed immediately prior
to the procedure.

Using sterile technique, 1% Lidocaine, MRI guidance, and a 9 gauge
vacuum assisted device, biopsy was performed of a mass in the
upper-outer right breast using a lateral approach. At the conclusion
of the procedure, a barbell shaped tissue marker clip was deployed
into the biopsy cavity. Follow-up 2-view mammogram was performed and
dictated separately.
IMPRESSION: MRI guided biopsy of a 4 mm mass in the upper-outer right breast. No
apparent complications.

## 2021-05-10 IMAGING — MG MM BREAST LOCALIZATION CLIP
4 series · 4 of 12 positions shown · non-contrast
Comparison: Previous exam(s).

CLINICAL DATA: Post biopsy mammogram of the right breast for clip
placement.

EXAM:
DIAGNOSTIC RIGHT MAMMOGRAM POST MRI BIOPSY

[R ML synth-2D]
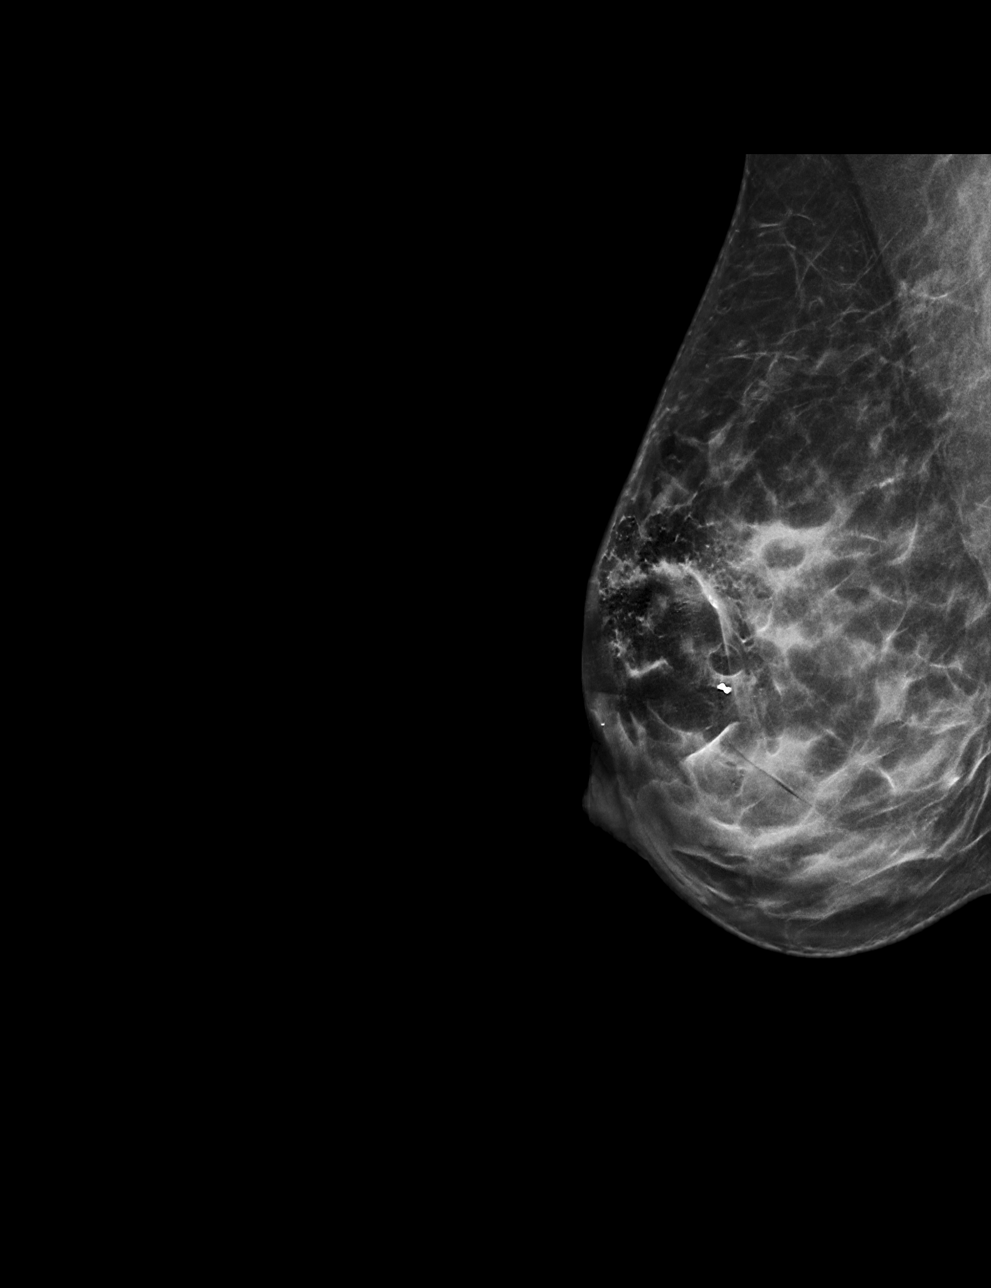

[R CC synth-2D]
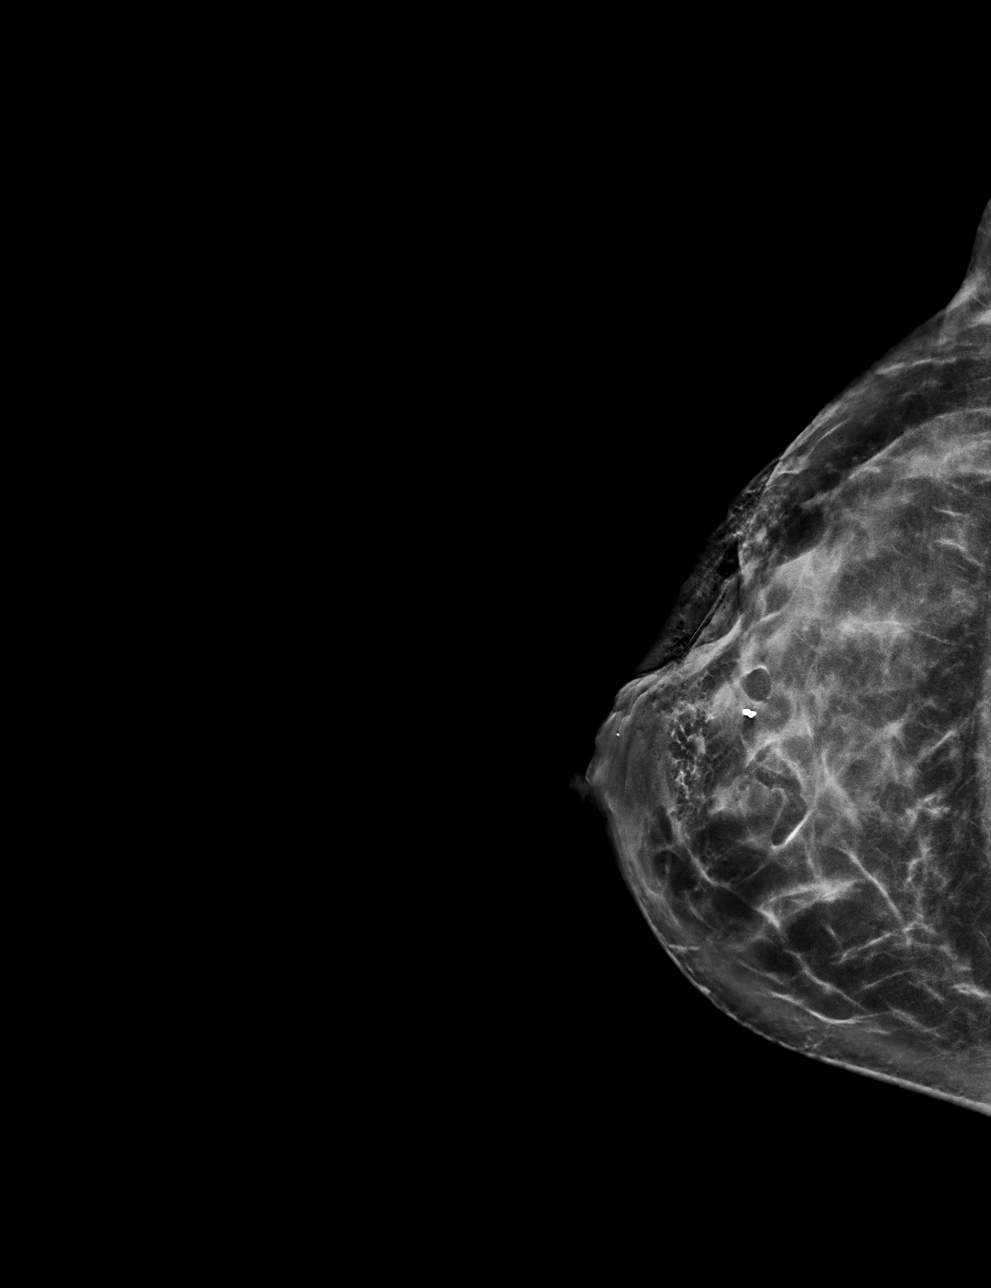

[R ML tomo · tomo slice 31/60.0]
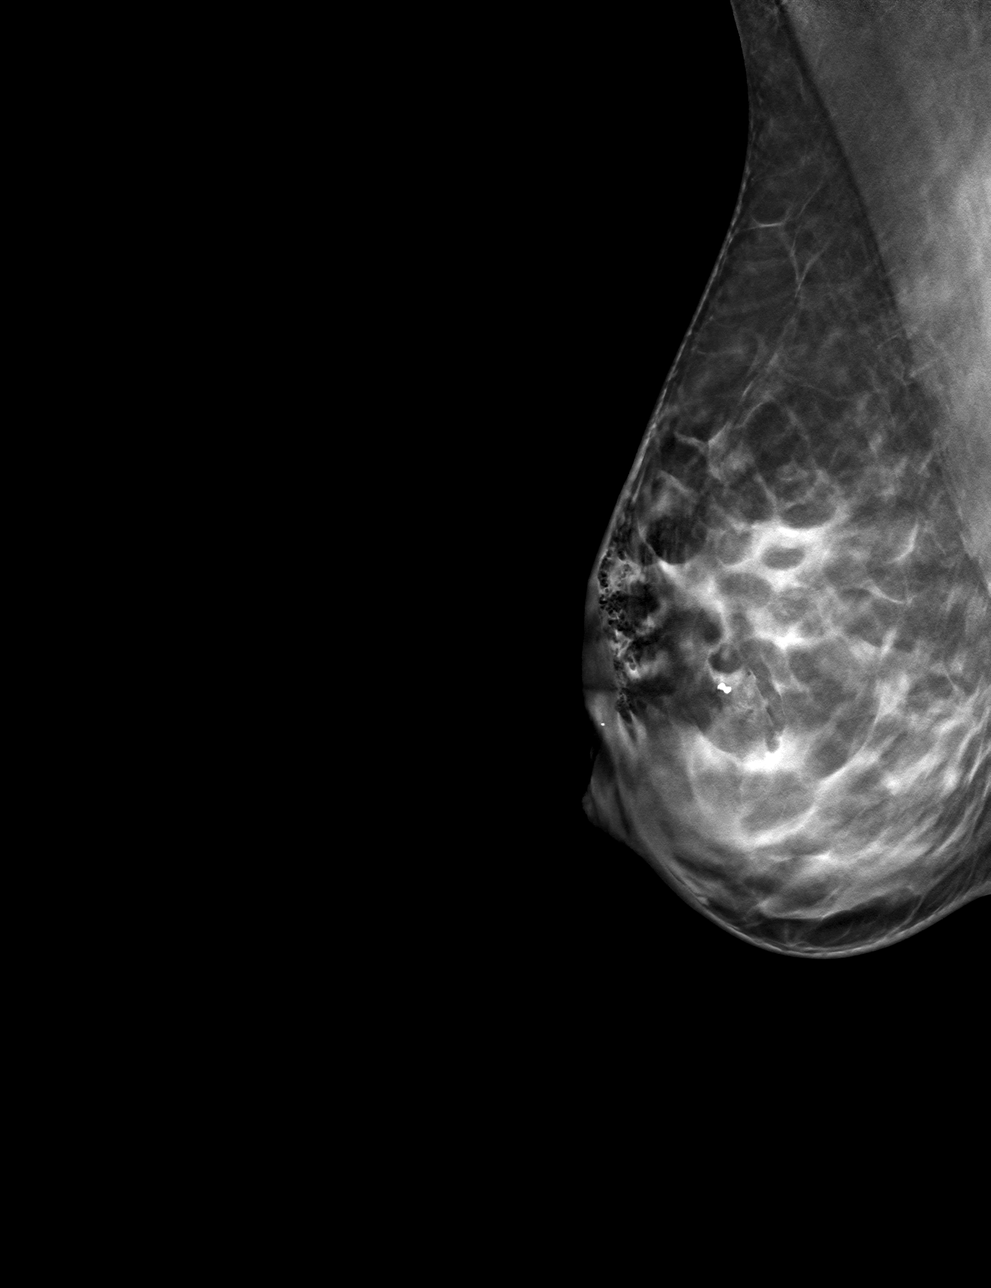

[R CC tomo · tomo slice 35/69.0]
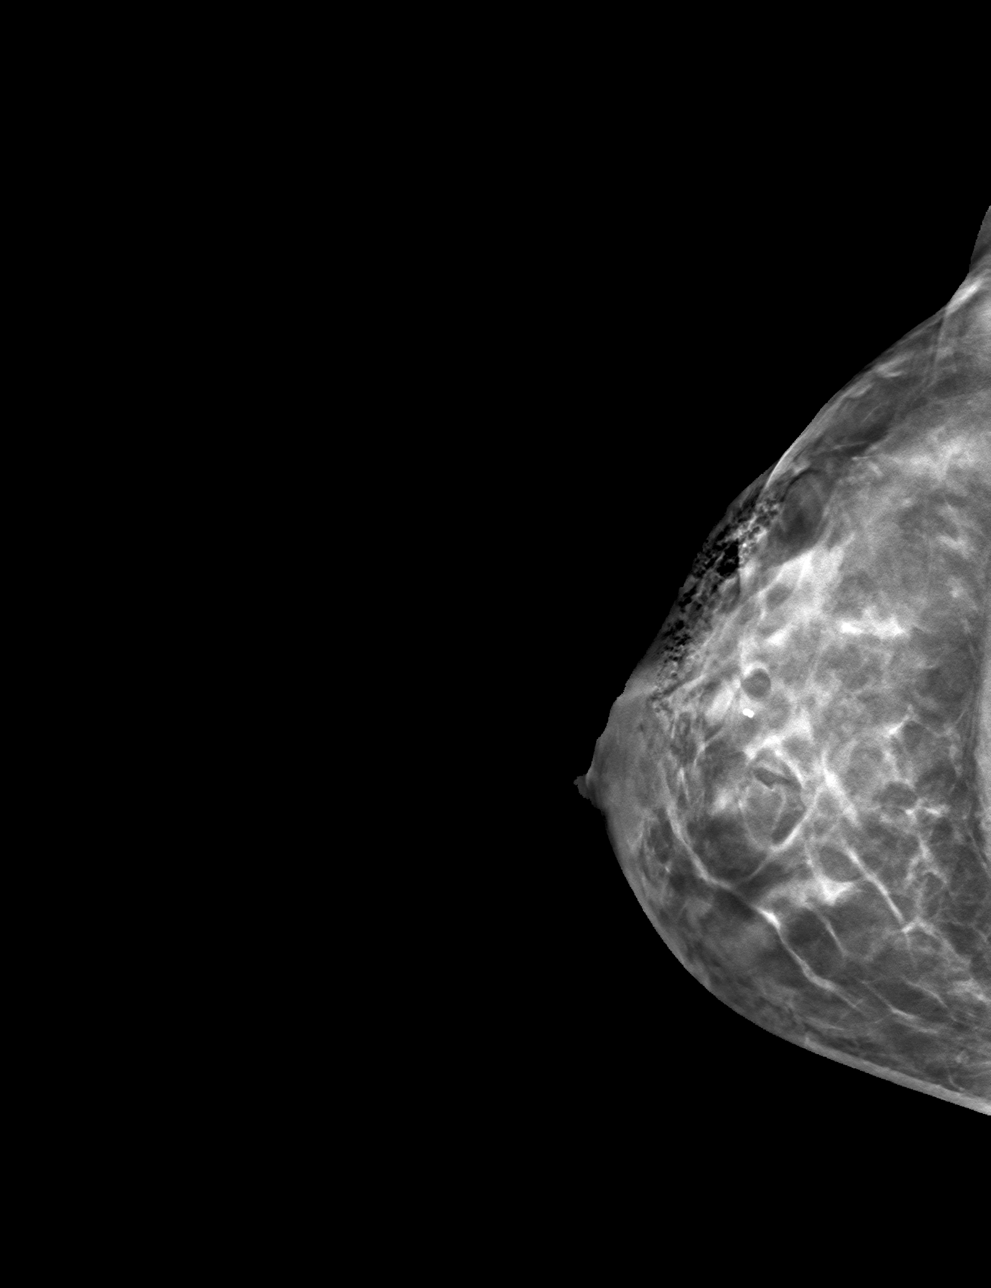

[4 of 12 positions shown; findings below may reference images not displayed]

FINDINGS: Mammographic images were obtained following MRI guided biopsy of a
mass in the upper-outer right breast. The biopsy marking clip is in
expected position at the site of biopsy.
IMPRESSION: Appropriate positioning of the dumbbell shaped biopsy marking clip
at the site of biopsy in the upper-outer right breast.

Final Assessment: Post Procedure Mammograms for Marker Placement

## 2021-05-10 MED ORDER — GADOBUTROL 1 MMOL/ML IV SOLN: 6.0000 mL | Freq: Once | INTRAVENOUS | Status: DC | PRN

## 2021-05-11 ENCOUNTER — Ambulatory Visit: Payer: Self-pay | Admitting: Genetic Counselor

## 2021-05-11 ENCOUNTER — Telehealth: Payer: Self-pay | Admitting: Genetic Counselor

## 2021-05-11 DIAGNOSIS — Z803 Family history of malignant neoplasm of breast: Secondary | ICD-10-CM

## 2021-05-11 DIAGNOSIS — Z8042 Family history of malignant neoplasm of prostate: Secondary | ICD-10-CM

## 2021-05-11 DIAGNOSIS — Z800 Family history of malignant neoplasm of digestive organs: Secondary | ICD-10-CM

## 2021-05-11 DIAGNOSIS — Z1379 Encounter for other screening for genetic and chromosomal anomalies: Secondary | ICD-10-CM

## 2021-05-11 DIAGNOSIS — Z1502 Genetic susceptibility to malignant neoplasm of ovary: Secondary | ICD-10-CM

## 2021-05-11 DIAGNOSIS — Z8481 Family history of carrier of genetic disease: Secondary | ICD-10-CM

## 2021-05-11 NOTE — Telephone Encounter (Signed)
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  Reviewed colon, breast, and other cancer risks and management strategies.  Discussed implications for family.  Follow up information to be sent to [name] about mutation.

## 2021-05-11 NOTE — Telephone Encounter (Signed)
Contacted patient in attempt to disclose results of genetic testing.  LVM with contact information requesting a call back.  

## 2021-05-14 ENCOUNTER — Encounter: Payer: Self-pay | Admitting: Genetic Counselor

## 2021-05-14 DIAGNOSIS — Z1589 Genetic susceptibility to other disease: Secondary | ICD-10-CM | POA: Insufficient documentation

## 2021-05-14 DIAGNOSIS — Z1502 Genetic susceptibility to malignant neoplasm of ovary: Secondary | ICD-10-CM | POA: Insufficient documentation

## 2021-05-14 DIAGNOSIS — Z1379 Encounter for other screening for genetic and chromosomal anomalies: Secondary | ICD-10-CM | POA: Insufficient documentation

## 2021-05-14 NOTE — Progress Notes (Signed)
GENETIC TEST RESULTS   Patient Name: Jeanette Rice Patient Age: 35 y.o. Encounter Date: 05/11/2021  Referring Provider: None   Jeanette Rice was seen in the Cancer Genetics clinic on Apr 19, 2021 due to a family history of cancer, a family history of a known hereditary cancer mutations, and concerns regarding a hereditary predisposition to cancer. Please refer to the prior Genetics clinic note for more information regarding Jeanette Rice's medical and family histories and our assessment at the time.   FAMILY HISTORY:  We obtained a detailed, 4-generation family history.  Significant diagnoses are listed below: Family History  Problem Relation Age of Onset   Breast cancer Mother        DCIS; dx 60s   Other Father        mutations in CHEK2 and APC genes   Breast cancer Maternal Grandmother        dx 80s   Colon cancer Maternal Grandmother 75   Prostate cancer Paternal Grandfather        dx 80s   Prostate cancer Other        mets? d. 82    Jeanette Rice has two sons, ages 3 and 6.  She has no siblings.     Her mother was diagnosed with DCIS in her 60s.  She had genetic testing through [facility] in 2017 and was negative.  Jeanette Rice's mother was found to have a variant of uncertain significance in the BARD1 gene called c.1694G>A (p.Arg565His). The [facility] gene panel offered by [facility] includes sequencing and deletion/duplication testing of the following 35 genes: APC, ATM, AXIN2, BARD1, BMPR1A, BRCA1, BRCA2, BRIP1, CHD1, CDK4, CDKN2A, CHEK2, EPCAM (large rearrangement only), HOXB13, (sequencing only), GALNT12, MLH1, MSH2, MSH3 (excluding repetitive portions of exon 1), MSH6, MUTYH, NBN, NTHL1, PALB2, PMS2, PTEN, RAD51C, RAD51D, RNF43, RPS20, SMAD4, STK11, and TP53. Sequencing was performed for select regions of POLE and POLD1, and large rearrangement analysis was performed for select regions of GREM1.  Jeanette Rice's maternal grandmother had a history of colon cancer in her  mid 70s and breast cancer in her 80s.   Jeanette Rice's father is 62 and has had an unknown number of colon polyps.  Jeanette Rice's father had genetic testing through [facility] in 2020 for the following genes:   APC, ATM, AXIN2, BARD1, BMPR1A, BRCA1, BRCA2, BRIP1, CDH1, CDKN2A, CHEK2, MLH1, MSH2, MSH3, MSH6, MUTYH, NBN, NF1, NTHL1, PALB2, PMS2, RAD51C, RAD51D, SMAD4, STK11, TP53 (sequencing and deletion/duplication); HOXB12, POLD1, POLE (sequencing only), and EPCAM and GREM1 (deletion/duplication only).  Jeanette Rice's father was found to have a pathogenic variant in the CHEK2 gene called p.S428F and a moderate risk mutation in the APC gene caled p.I1307K.  Jeanette Rice's paternal grandfather had a history of prostate cancer around age 75 and also a history of leukemia.  Jeanette Rice's paternal grandfather's brother had prostate cancer, possibly metastatic.     Jeanette Rice is unaware of previous family history of genetic testing for hereditary cancer risks besides that mentioned above. There is reported Ashkenazi Jewish ancestry. There is no known consanguinity.  GENETIC TESTING:  Genetic testing reported out on May 10, 2021 through the [facility] panel.  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  This is the same variant detected previously in her father's genetic testing per the report she provided and is an Ashkenazi Jewish founder mutation.    We also recommended Jeanette Rice pursue testing for the   familial hereditary cancer gene mutation called APC p.I1307K (c.3920T>A). Jeanette Rice test did not reveal the familial mutation in APC. We call this result a true negative result because a cancer-causing mutation was identified in Jeanette Rice paternal family, and she did not inherit it.  Given this negative result, Jeanette Rice chances of developing APC-related cancers are the same as they are in the general population.    Of note, since  the CHEK2 family variant was inherited from her paternal family, we discussed that this variant does not explain her maternal family history of breast cancer. We discussed with Jeanette Rice that because current genetic testing is not perfect, it is possible there may be a gene mutation in one of these genes that current testing cannot detect, but that chance is small.  We also discussed, that there could be another gene that has not yet been discovered, or that we have not yet tested, that is responsible for the cancer diagnoses in the family.  Therefore, it is important to remain in touch with cancer genetics in the future so that we can continue to offer Jeanette Rice and her maternal family members the most up to date genetic testing.   DISCUSSION: CHEK2 mutations have been found to be associated with an increased risk of breast, colon, prostate, and possibly other cancers. The majority of research regarding CHEK2 and cancer risks have been done on the protein-truncating 1100delC mutation.  Jeanette Rice p.S428F (c.1283C>T) missense variant may confer similar or lower risks compared to that of the more Rice-studied 1100delC variant (PMID: 56387564). These estimated cancer risks vary widely and may be influenced by family history. Women with a CHEK2 deleterious mutation have approximately a 24% (no family history of breast cancer) to 48% (strong family history of breast cancer) lifetime risk of breast cancer and up to a 25% risk of a second breast cancer. Men may have an increased risk for female breast cancer of about 1%. Men and women may have an increased risk of colon cancer, and men have an increased risk for prostate cancer, although precise lifetime risk estimates are not established. There is preliminary evidence supporting a correlation with CHEK2 and autosomal dominant predisposition to other cancer types including urinary tract cancer, ovarian cancer and melanoma (PMID: 33295188, 41660630, 16010932,  35573220, 25427062); however, the available evidence is insufficient to make a determination regarding these relationships.  The specific p.S428F mutation has been estimated to confer approximately a two fold increased risk for breast cancer in females of Ashkenazi Jewish decent (PMID: 37628315).  However, more research of this specific mutation is needed.   An individual's cancer risk and medical management are not determined by genetic test results alone. Overall cancer risk assessment incorporates additional factors, including personal medical history, family history, and any available genetic information that may result in a personalized plan for cancer prevention and surveillance.   Breast Cancer Management:  Women with a deleterious CHEK2 mutation have an increased lifetime risk for breast cancer. The following is recommended for individuals with a CHEK2 mutation, according to the NCCN Guidelines (Genetic/Familial High-Risk Assessment:  Breast, Ovarian, and Pancreatic, Version 1.2021):  Screening: Annual mammogram with consideration of tomosynthesis and consider breast MRI with contrast starting at age 32. Evidence is insufficient for risk-reducing mastectomy. Manage based on family history.  Colon Cancer Management:  Men and women with a deleterious CHEK2 mutation have an increased lifetime risk for colon cancer. According to the NCCN Guidelines (Genetic/Familial High-Risk Assessment: Colorectal, Version 1.2020), the following  is recommended for individuals with a CHEK2 mutation:  Personal history of colon cancer:  Follow instructions provided by your physician based on your personal history. Do not have a personal history of colon cancer but have a parent/sibling/child with colon cancer:  Colonoscopy every 5 years starting at age 40 or 10 years younger than the earliest age of onset, whichever is younger. Do not have a personal history of colon cancer and do not have a parent/sibling/child with  colon cancer:  Colonoscopy every 5 years starting at age 40.   FAMILY MEMBERS: It is important that all of Ms. Fildes's relatives (both men and women) know of the presence of this gene mutation.  Women need to know that they may be at increased risk for breast and colon cancers.  Men are at slightly increased risk for breast, prostate and colon cancers.  Genetic testing can sort out who in your family is at risk and who is not.  We would be happy to help meet with and coordinate genetic testing for any relative that is interested.  Ms. Keshishyan's children are at 50% risk to have inherited the mutation found in her. Ms. Rheaume'S children, however, are relatively young and this will not be of any consequence to them for several years. We do not test children because there is no risk to them until they are adults.  It should also be kept in mind that for children, we are bound to know a great deal more about breast cancer and its prevention in several years' time. We recommend that Ms. Circle'S children have genetic counseling and testing by the time that they are in their early 20s.    Given Ms. Ramdass's father has the same CHEK2 variant, Ms. Minus's paternal relatives may also have this variant.  We recommend they have genetic testing for this same mutation, as identifying the presence of this mutation would allow them to also take advantage of risk-reducing measures.   Our knowledge of cancer risks related to CHEK2 mutations will continue to evolve. We recommended that Ms. Ingalls follow up with the genetics clinic annually so we can provide her with the most current information about CHEK2 and cancer risk, as Rice as with any changes to her family history (new cancer diagnoses, genetic test results).  SUPPORT AND RESOURCES:  Support groups for hereditary cancer syndrome information and support are available, such as Facing Our Risk (www.facingourrisk.com) and Bright Pink (www.brightpink.org).  They provide  opportunities to speak with other individuals from high-risk families.    Our contact number was provided. Ms. Casanas's questions were answered to her satisfaction, and she knows she is welcome to call us at anytime with additional questions or concerns.   Cari M. Koerner, MS, LCGC Certified Genetic Counselor [email or web address] (P) [phone]    

## 2021-05-15 ENCOUNTER — Encounter: Payer: Self-pay | Admitting: Genetic Counselor

## 2021-08-20 ENCOUNTER — Ambulatory Visit (INDEPENDENT_AMBULATORY_CARE_PROVIDER_SITE_OTHER): Payer: 59 | Admitting: Neurology

## 2021-08-20 ENCOUNTER — Encounter: Payer: Self-pay | Admitting: Neurology

## 2021-08-20 VITALS — BP 104/58 | HR 72 | Ht 63.0 in | Wt 145.0 lb

## 2021-08-20 DIAGNOSIS — U099 Post covid-19 condition, unspecified: Secondary | ICD-10-CM

## 2021-08-20 DIAGNOSIS — G9332 Myalgic encephalomyelitis/chronic fatigue syndrome: Secondary | ICD-10-CM | POA: Insufficient documentation

## 2021-08-20 DIAGNOSIS — R5382 Chronic fatigue, unspecified: Secondary | ICD-10-CM | POA: Diagnosis not present

## 2021-08-20 DIAGNOSIS — Z8616 Personal history of COVID-19: Secondary | ICD-10-CM | POA: Diagnosis not present

## 2021-08-20 HISTORY — DX: Myalgic encephalomyelitis/chronic fatigue syndrome: G93.32

## 2021-08-20 HISTORY — DX: Post covid-19 condition, unspecified: U09.9

## 2021-08-20 MED ORDER — DULOXETINE HCL 20 MG PO CPEP: 20.0000 mg | ORAL_CAPSULE | Freq: Every day | ORAL | 3 refills | Status: DC

## 2021-08-20 NOTE — Progress Notes (Signed)
Reason for visit: Post COVID symptoms  Jeanette Rice is an 35 y.o. female  History of present illness:  Jeanette Rice is a 35 year old left-handed white female with a history of a COVID infection in the summer 2020.  Since that time, she has had significant issues with fatigue and some tingling sensations in the back and legs that gradually has improved.  The patient got COVID again in January 2022, she had some worsening symptoms within several weeks after the infection.  She at times will have episodes where she has difficulty staying awake throughout the day, other times she does much better.  She has Adderall in low-dose to take.  She takes this very occasionally.  At times she may have hot flashes without fever.  She oftentimes will have increased stiffness and heaviness in the legs if she overdoes exercise.  She will have some days with more severe brain fog.  The Adderall does help this.  She denies any issues with balance or difficulty controlling the bowels or the bladder.  MRI of the brain done previously was unremarkable, no evidence of demyelinating disease was noted.  She has a positive ANA, a rheumatology evaluation was done, it was not felt that she had lupus.  She has had antibody testing showing prior exposure to mycoplasma pneumonia, CMV, and Epstein-Barr virus.  Past Medical History:  Diagnosis Date   COVID-19 virus infection    July 2020   Family history of breast cancer 04/20/2021   Family history of colon cancer 04/20/2021   Family history of gene mutation in CHEK2 and APC 04/20/2021   Family history of prostate cancer 04/20/2021    Past Surgical History:  Procedure Laterality Date   WISDOM TOOTH EXTRACTION      Family History  Problem Relation Age of Onset   Breast cancer Mother        DCIS; dx 11s   Other Father        mutations in CHEK2 and APC genes   Congestive Heart Failure Maternal Grandmother 85   Breast cancer Maternal Grandmother        dx 59s   Colon  cancer Maternal Grandmother 75   Prostate cancer Paternal Grandfather        dx 34s   Prostate cancer Other        mets? d. 70    Social history:  reports that she has never smoked. She has never used smokeless tobacco. She reports that she does not drink alcohol and does not use drugs.   No Known Allergies  Medications:  Prior to Admission medications   Medication Sig Start Date End Date Taking? Authorizing Provider  amphetamine-dextroamphetamine (ADDERALL) 5 MG tablet Take 1 tablet (5 mg total) by mouth daily. 01/12/21   Kathrynn Ducking, MD    ROS:  Out of a complete 14 system review of symptoms, the patient complains only of the following symptoms, and all other reviewed systems are negative.  Fatigue "Brain fog"  Height _0  (1.6 m), weight 145 lb (65.8 kg), unknown if currently breastfeeding.  Physical Exam  General: The patient is alert and cooperative at the time of the examination.  Skin: No significant peripheral edema is noted.   Neurologic Exam  Mental status: The patient is alert and oriented x 3 at the time of the examination. The patient has apparent normal recent and remote memory, with an apparently normal attention span and concentration ability.   Cranial nerves: Facial symmetry is  present. Speech is normal, no aphasia or dysarthria is noted. Extraocular movements are full. Visual fields are full.  Motor: The patient has good strength in all 4 extremities.  Sensory examination: Soft touch sensation is symmetric on the face, arms, and legs.  Coordination: The patient has good finger-nose-finger and heel-to-shin bilaterally.  Gait and station: The patient has a normal gait. Tandem gait is normal. Romberg is negative. No drift is seen.  Reflexes: Deep tendon reflexes are symmetric.   MRI brain 05/25/20:   IMPRESSION:    Normal MRI brain (with and without).    Assessment/Plan:  1.  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  She is limited in her ability to function by fatigue and heaviness of the legs.  She does have some stiffness and discomfort at times, we will try low-dose Cymbalta, she will use the Adderall if needed.  She will follow-up here in 6 months, in the future she can be followed through Dr. Krista Blue.  Jill Alexanders MD 08/20/2021 9:07 AM  [facility] [address] [place], [place] [address]  Phone [phone] Fax [phone]

## 2021-10-08 DIAGNOSIS — Q999 Chromosomal abnormality, unspecified: Secondary | ICD-10-CM | POA: Insufficient documentation

## 2022-02-07 DIAGNOSIS — D2261 Melanocytic nevi of right upper limb, including shoulder: Secondary | ICD-10-CM | POA: Diagnosis not present

## 2022-02-07 DIAGNOSIS — D225 Melanocytic nevi of trunk: Secondary | ICD-10-CM | POA: Diagnosis not present

## 2022-02-07 DIAGNOSIS — D2222 Melanocytic nevi of left ear and external auricular canal: Secondary | ICD-10-CM | POA: Diagnosis not present

## 2022-02-07 DIAGNOSIS — D2271 Melanocytic nevi of right lower limb, including hip: Secondary | ICD-10-CM | POA: Diagnosis not present

## 2022-02-07 DIAGNOSIS — D2262 Melanocytic nevi of left upper limb, including shoulder: Secondary | ICD-10-CM | POA: Diagnosis not present

## 2022-02-07 DIAGNOSIS — L814 Other melanin hyperpigmentation: Secondary | ICD-10-CM | POA: Diagnosis not present

## 2022-02-07 DIAGNOSIS — D1801 Hemangioma of skin and subcutaneous tissue: Secondary | ICD-10-CM | POA: Diagnosis not present

## 2022-02-07 DIAGNOSIS — D2272 Melanocytic nevi of left lower limb, including hip: Secondary | ICD-10-CM | POA: Diagnosis not present

## 2022-02-21 ENCOUNTER — Ambulatory Visit: Payer: Self-pay | Admitting: Neurology

## 2022-02-21 NOTE — Progress Notes (Deleted)
?Charlann Boxer D.O. ?[facility] ?[place] ?Phone: [phone] ?Subjective:   ? ?I'm seeing this patient by the request  of:  Chesley Noon, MD ? ?CC:  ? ?[id number]  ?Jeanette Rice is a 36 y.o. female coming in with complaint of L hip, knee and lumbar spine pain. Patient states  ? ? ?  ? ?Past Medical History:  ?Diagnosis Date  ? 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 COVID-19 virus infection   ? July 2020  ? Family history of breast cancer 04/20/2021  ? Family history of colon cancer 04/20/2021  ? Family history of gene mutation in [place] and APC 04/20/2021  ? Family history of prostate cancer 04/20/2021  ? ?Past Surgical History:  ?Procedure Laterality Date  ? WISDOM TOOTH EXTRACTION    ? ?Social History  ? ?Socioeconomic History  ? Marital status: Married  ?  Spouse name: Not on file  ? Number of children: 2  ? Years of education: college  ? Highest education level: Not on file  ?Occupational History  ? Occupation: Unemployed  ?  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 Smoking status: Never  ? Smokeless tobacco: Never  ?Substance and Sexual Activity  ? Alcohol use: No  ? Drug use: No  ? Sexual activity: Yes  ?  Partners: Male  ?Other Topics Concern  ? Not on file  ?Social History Narrative  ? She is originally from the [place] area and moved to [facility] for her husband's job.  ? Lives at home with family.-Husband and 2 children ages 6 and 65.  ? 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  ? Leftt-handed.  ? 1 cup caffeine per day.  ? ?Social Determinants of Health  ? ?Financial Resource Strain: Not on file  ?Food Insecurity: Not on file  ?Transportation Needs: Not on file  ?Physical Activity: Not on file  ?Stress: Not on file  ?Social Connections: Not on file  ? ?No Known Allergies ?Family History  ?Problem Relation Age of Onset  ? Breast cancer Mother   ?     DCIS; dx 51s  ? Other Father   ?     mutations in CHEK2 and APC genes  ? Congestive Heart Failure  Maternal Grandmother 85  ? Breast cancer Maternal Grandmother   ?     dx 75s  ? Colon cancer Maternal Grandmother 19  ? Prostate cancer Paternal Grandfather   ?     dx 56s  ? Prostate cancer Other   ?     mets? d. 58  ? ? ? ? ? ? ? ?Current Outpatient Medications (Other):  ?  amphetamine-dextroamphetamine (ADDERALL) 5 MG tablet, Take 1 tablet (5 mg total) by mouth daily. ?  DULoxetine (CYMBALTA) 20 MG capsule, Take 1 capsule (20 mg total) by mouth daily. ? ? ?Reviewed prior external information including notes and imaging from  ?primary care provider ?As well as notes that were available from care everywhere and other healthcare systems. ? ?Past medical history, social, surgical and family history all reviewed in electronic medical record.  No pertanent information unless stated regarding to the chief complaint.  ? ?Review of Systems: ? No headache, visual changes, nausea, vomiting, diarrhea, constipation, dizziness, abdominal pain, skin rash, fevers, chills, night sweats, weight loss, swollen lymph nodes, body aches, joint swelling, chest pain, shortness of breath, mood changes. POSITIVE muscle aches ? ?Objective  ?unknown if currently breastfeeding. ?  ?General: No apparent  distress alert and oriented x3 mood and affect normal, dressed appropriately.  ?HEENT: Pupils equal, extraocular movements intact  ?Respiratory: Patient's speak in full sentences and does not appear short of breath  ?Cardiovascular: No lower extremity edema, non tender, no erythema  ?Gait normal with good balance and coordination.  ?MSK:  Non tender with full range of motion and good stability and symmetric strength and tone of shoulders, elbows, wrist, hip, knee and ankles bilaterally.  ? ?  ?Impression and Recommendations:  ?  ? ?The above documentation has been reviewed and is accurate and complete Jeanette Rice ? ? ?

## 2022-02-26 ENCOUNTER — Ambulatory Visit: Payer: Self-pay | Admitting: Family Medicine

## 2022-03-12 ENCOUNTER — Ambulatory Visit: Payer: Self-pay | Admitting: Family Medicine

## 2022-03-14 ENCOUNTER — Ambulatory Visit: Payer: 59 | Admitting: Family Medicine

## 2022-03-14 ENCOUNTER — Encounter: Payer: Self-pay | Admitting: Family Medicine

## 2022-03-14 VITALS — BP 116/74 | HR 71 | Temp 98.4°F | Ht 63.0 in | Wt 147.0 lb

## 2022-03-14 DIAGNOSIS — Z8616 Personal history of COVID-19: Secondary | ICD-10-CM

## 2022-03-14 DIAGNOSIS — R5383 Other fatigue: Secondary | ICD-10-CM | POA: Diagnosis not present

## 2022-03-14 DIAGNOSIS — E7800 Pure hypercholesterolemia, unspecified: Secondary | ICD-10-CM | POA: Diagnosis not present

## 2022-03-14 NOTE — Addendum Note (Signed)
Addended by: Laddie Aquas A on: 03/14/2022 10:23 AM ? ? Modules accepted: Orders ? ?

## 2022-03-14 NOTE — Patient Instructions (Signed)
Welcome to [facility]! It was a pleasure meeting you today.  As discussed, Please schedule a 6 month follow up visit today.  PLEASE NOTE:  If you had any LAB tests please let us know if you have not heard back within a few days. You may see your results on MyChart before we have a chance to review them but we will give you a call once they are reviewed by us. If we ordered any REFERRALS today, please let us know if you have not heard from their office within the next week.  Let us know through MyChart if you are needing REFILLS, or have your pharmacy send us the request. You can also use MyChart to communicate with me or any office staff.  Please try these tips to maintain a healthy lifestyle:  Eat most of your calories during the day when you are active. Eliminate processed foods including packaged sweets (pies, cakes, cookies), reduce intake of potatoes, white bread, white pasta, and white rice. Look for whole grain options, oat flour or almond flour.  Each meal should contain half fruits/vegetables, one quarter protein, and one quarter carbs (no bigger than a computer mouse).  Cut down on sweet beverages. This includes juice, soda, and sweet tea. Also watch fruit intake, though this is a healthier sweet option, it still contains natural sugar! Limit to 3 servings daily.  Drink at least 1 glass of water with each meal and aim for at least 8 glasses per day  Exercise at least 150 minutes every week.   

## 2022-03-14 NOTE — Progress Notes (Signed)
? ?New Patient Office Visit ? ?Subjective:  ?Patient ID: Jeanette Rice, female    DOB: 1985/12/13  Age: 36 y.o. MRN: [id number] ? ?CC:  ?Chief Complaint  ?Patient presents with  ? Establish Care  ?  Fasting for blood work to check cholesterol  ? ? ?HPI ?Jeanette Rice presents for new pt.  Was seeing [facility].  Had cpx in Nov.  Insurance changed ? 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  Got covid 2020-fatigued since.  Would get better, but then get covid again x2.  Has seen rheum, card.  Severe fatigue and achiness.  Naps daily. Legs heavy.  Takes ivermectin intermitt. Helps w/alertness.  Walks daily. Adderall only after covid episodes.  Still w/same bottle from 3 yrs ago. Taking vitamins and supps. Seeing alt med as well.  Has list of labs wants done.  Also inquiring about endo for wt gain. Got MRI to make sure not MS ?FH breast ca-mom.  Pt has had MRI. Had Bx.  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  Chol increased some.  Eats out a lot but always has.   ? ?Past Medical History:  ?Diagnosis Date  ? Allergy   ? 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 COVID-19 virus infection   ? July 2020  ? Family history of breast cancer 04/20/2021  ? Family history of colon cancer 04/20/2021  ? Family history of gene mutation in [place] and APC 04/20/2021  ? Family history of prostate cancer 04/20/2021  ? ? ?Past Surgical History:  ?Procedure Laterality Date  ? WISDOM TOOTH EXTRACTION    ? ? ?Family History  ?Problem Relation Age of Onset  ? Cancer Mother   ? Breast cancer Mother   ?     DCIS; dx 77s  ? Other Father   ?     mutations in CHEK2 and APC genes  ? Depression Maternal Grandmother   ? Cancer Maternal Grandmother   ? Congestive Heart Failure Maternal Grandmother 85  ? Breast cancer Maternal Grandmother   ?     dx 72s  ? Colon cancer Maternal Grandmother 21  ? Hypertension Maternal Grandfather   ? Heart disease Maternal Grandfather   ?  Hypertension Paternal Grandmother   ? Heart disease Paternal Grandmother   ? Cancer Paternal Grandfather   ? Prostate cancer Paternal Grandfather   ?     dx 66s  ? Prostate cancer Other   ?     mets? d. 7  ? ? ?Social History  ? ?Socioeconomic History  ? Marital status: Married  ?  Spouse name: Not on file  ? Number of children: 2  ? Years of education: college  ? Highest education level: Not on file  ?Occupational History  ? Occupation: Unemployed  ?  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 Smoking status: Never  ? Smokeless tobacco: Never  ?Vaping Use  ? Vaping Use: Never used  ?Substance and Sexual Activity  ? Alcohol use: No  ? Drug use: No  ? Sexual activity: Yes  ?  Partners: Male  ?  Birth control/protection: Pill  ?  Comment: vasectomy  ?Other Topics Concern  ? Not on file  ?Social History Narrative  ? She is originally from the [place] area and moved to [place] for her husband's job.  ? Lives at home with family.-Husband and 2 children ages 49 and 47.  ? 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  ?  Leftt-handed.  ? 1 cup caffeine per day.  ? ?Social Determinants of Health  ? ?Financial Resource Strain: Not on file  ?Food Insecurity: Not on file  ?Transportation Needs: Not on file  ?Physical Activity: Not on file  ?Stress: Not on file  ?Social Connections: Not on file  ?Intimate Partner Violence: Not on file  ? ? ?ROS  ?ROS: ?Gen: no fever, chills  ?Skin: no rash, itching ?ENT: no ear pain, ear drainage, nasal congestion, rhinorrhea, sinus pressure, sore throat ?Eyes: no blurry vision, double vision ?Resp: no cough, wheeze,SOB ?CV: no CP, LE edema, occ palp-has seen card ?GI: no heartburn, n/v/d/c, abd pain ?GU: no dysuria, urgency, frequency, hematuria ?MSK: achey ?Neuro: + weakness ? ? ?Objective:  ? ?Today's Vitals: BP 116/74   Pulse 71   Temp 98.4 ?F (36.9 ?C) (Temporal)   Ht 5' 3"  (1.6 m)   Wt 147 lb (66.7 kg)   SpO2 99%   Breastfeeding No   BMI 26.04 kg/m?  ? ?Physical Exam  ?Gen: WDWN NAD ?HEENT: NCAT,  conjunctiva not injected, sclera nonicteric ?NECK:  supple, no thyromegaly, no nodes, no carotid bruits ?CARDIAC: RRR, S1S2+, no murmur. DP 2+B ?LUNGS: CTAB. No wheezes ?ABDOMEN:  BS+, soft, NTND, No HSM, no masses ?EXT:  no edema ?MSK: no gross abnormalities.  ?NEURO: A&O x3.  CN II-XII intact.  ?PSYCH: normal mood. Good eye contact  ? ?Assessment & Plan:  ? ?Problem List Items Addressed This Visit   ? ?  ? Other  ? History of COVID-19 (Chronic)  ? Relevant Orders  ? TSH  ? Thyroid Peroxidase Antibodies (TPO) (REFL)  ? VITAMIN D 25 Hydroxy (Vit-D Deficiency, Fractures)  ? Vitamin B12  ? ANA  ? CBC with Differential/Platelet  ? Comprehensive metabolic panel  ? C-reactive protein  ? Hemoglobin A1c  ? Sedimentation rate  ? T3, free  ? T4, free  ? Histamine Determination, Blood  ? Iron, TIBC and Ferritin Panel  ? Fatigue  ? Relevant Orders  ? TSH  ? Thyroid Peroxidase Antibodies (TPO) (REFL)  ? VITAMIN D 25 Hydroxy (Vit-D Deficiency, Fractures)  ? Vitamin B12  ? ANA  ? CBC with Differential/Platelet  ? Comprehensive metabolic panel  ? C-reactive protein  ? Hemoglobin A1c  ? Lipid panel  ? Sedimentation rate  ? T3, free  ? T4, free  ? Histamine Determination, Blood  ? Iron, TIBC and Ferritin Panel  ? ?Other Visit Diagnoses   ? ? Pure hypercholesterolemia    -  Primary  ? Relevant Orders  ? Comprehensive metabolic panel  ? Hemoglobin A1c  ? Lipid panel  ? Iron, TIBC and Ferritin Panel  ? ?  ? Pure hyperlipidemia-chol 201 w/LDL 127 and HDL 62 in Nov.  Discussed at goal for her  work on [place].  Will check CMP,A1C,Lipids ?Chronic fatigue-prob d/t long Covid-has had mult w/u in past.  Still limited as to what she can do.  Wants repeats labs-cmp, thyroid panel, ANA(has been + and neg).  Histamine, crp, d, b12, ccbc, iron studies ?H/o covid x3.  Prob Long hauler's as continues w/fatigue/weakness.  May be fibromyalgia as well.  Check labs as above.  ?F/u Nov cpx.  ? ?I reviewed chart for 61mn while pt being roomed, 360m w/pt  getting history and exam and discussing previous labs, ordering labs.  So 40-45 min total ? ?Outpatient Encounter Medications as of 03/14/2022  ?Medication Sig  ? amphetamine-dextroamphetamine (ADDERALL) 5 MG tablet Take 1 tablet (5 mg  total) by mouth daily.  ? ivermectin (STROMECTOL) 3 MG TABS tablet Take by mouth.  ? LO LOESTRIN FE 1 MG-10 MCG / 10 MCG tablet Take 1 tablet by mouth daily.  ? [DISCONTINUED] valACYclovir (VALTREX) 500 MG tablet valacyclovir 500 mg tablet ? START 1 TABLET 1 TIME A DAY FOR 3 DAYS THEN INCREASE TO 2 TABLETS 1 TIME A DAY  ? [DISCONTINUED] DULoxetine (CYMBALTA) 20 MG capsule Take 1 capsule (20 mg total) by mouth daily.  ? ?No facility-administered encounter medications on file as of 03/14/2022.  ? ? ?Follow-up: Return for annual in nov.  ? ?Wellington Hampshire, MD ?

## 2022-03-19 DIAGNOSIS — Z8616 Personal history of COVID-19: Secondary | ICD-10-CM | POA: Diagnosis not present

## 2022-03-19 DIAGNOSIS — R5383 Other fatigue: Secondary | ICD-10-CM | POA: Diagnosis not present

## 2022-03-19 DIAGNOSIS — E7800 Pure hypercholesterolemia, unspecified: Secondary | ICD-10-CM | POA: Diagnosis not present

## 2022-03-20 DIAGNOSIS — Z03818 Encounter for observation for suspected exposure to other biological agents ruled out: Secondary | ICD-10-CM | POA: Diagnosis not present

## 2022-03-20 DIAGNOSIS — Z20822 Contact with and (suspected) exposure to covid-19: Secondary | ICD-10-CM | POA: Diagnosis not present

## 2022-03-20 DIAGNOSIS — J029 Acute pharyngitis, unspecified: Secondary | ICD-10-CM | POA: Diagnosis not present

## 2022-03-20 DIAGNOSIS — J020 Streptococcal pharyngitis: Secondary | ICD-10-CM | POA: Diagnosis not present

## 2022-03-21 LAB — COMPREHENSIVE METABOLIC PANEL
ALT: 13 IU/L (ref 0–32)
AST: 20 IU/L (ref 0–40)
Albumin/Globulin Ratio: 2 (ref 1.2–2.2)
Albumin: 4.9 g/dL — ABNORMAL HIGH (ref 3.8–4.8)
Alkaline Phosphatase: 62 IU/L (ref 44–121)
BUN/Creatinine Ratio: 14 (ref 9–23)
BUN: 11 mg/dL (ref 6–20)
Bilirubin Total: 0.3 mg/dL (ref 0.0–1.2)
CO2: 21 mmol/L (ref 20–29)
Calcium: 9.7 mg/dL (ref 8.7–10.2)
Chloride: 105 mmol/L (ref 96–106)
Creatinine, Ser: 0.79 mg/dL (ref 0.57–1.00)
Globulin, Total: 2.4 g/dL (ref 1.5–4.5)
Glucose: 83 mg/dL (ref 70–99)
Potassium: 4.2 mmol/L (ref 3.5–5.2)
Sodium: 142 mmol/L (ref 134–144)
Total Protein: 7.3 g/dL (ref 6.0–8.5)
eGFR: 98 mL/min/{1.73_m2} (ref >59)

## 2022-03-21 LAB — HEMOGLOBIN A1C
Est. average glucose Bld gHb Est-mCnc: 94 mg/dL
Hgb A1c MFr Bld: 4.9 % (ref 4.8–5.6)

## 2022-03-21 LAB — LIPID PANEL
Chol/HDL Ratio: 3.1 ratio (ref 0.0–4.4)
Cholesterol, Total: 177 mg/dL (ref 100–199)
HDL: 58 mg/dL (ref >39)
LDL Chol Calc (NIH): 104 mg/dL — ABNORMAL HIGH (ref 0–99)
Triglycerides: 78 mg/dL (ref 0–149)
VLDL Cholesterol Cal: 15 mg/dL (ref 5–40)

## 2022-03-21 LAB — CBC WITH DIFFERENTIAL/PLATELET
Basophils Absolute: 0 10*3/uL (ref 0.0–0.2)
Basos: 0 %
EOS (ABSOLUTE): 0.2 10*3/uL (ref 0.0–0.4)
Eos: 2 %
Hematocrit: 41.9 % (ref 34.0–46.6)
Hemoglobin: 13.7 g/dL (ref 11.1–15.9)
Immature Grans (Abs): 0 10*3/uL (ref 0.0–0.1)
Immature Granulocytes: 0 %
Lymphocytes Absolute: 1.2 10*3/uL (ref 0.7–3.1)
Lymphs: 15 %
MCH: 31.9 pg (ref 26.6–33.0)
MCHC: 32.7 g/dL (ref 31.5–35.7)
MCV: 98 fL — ABNORMAL HIGH (ref 79–97)
Monocytes Absolute: 0.5 10*3/uL (ref 0.1–0.9)
Monocytes: 5 %
Neutrophils Absolute: 6.4 10*3/uL (ref 1.4–7.0)
Neutrophils: 78 %
Platelets: 210 10*3/uL (ref 150–450)
RBC: 4.29 x10E6/uL (ref 3.77–5.28)
RDW: 11.6 % — ABNORMAL LOW (ref 11.7–15.4)
WBC: 8.3 10*3/uL (ref 3.4–10.8)

## 2022-03-21 LAB — IRON,TIBC AND FERRITIN PANEL
Ferritin: 80 ng/mL (ref 15–150)
Iron Saturation: 31 % (ref 15–55)
Iron: 102 ug/dL (ref 27–159)
Total Iron Binding Capacity: 325 ug/dL (ref 250–450)
UIBC: 223 ug/dL (ref 131–425)

## 2022-03-21 LAB — TSH: TSH: 1.67 u[IU]/mL (ref 0.450–4.500)

## 2022-03-21 LAB — VITAMIN B12: Vitamin B-12: 468 pg/mL (ref 232–1245)

## 2022-03-21 LAB — SEDIMENTATION RATE: Sed Rate: 2 mm/hr (ref 0–32)

## 2022-03-21 LAB — ANA: Anti Nuclear Antibody (ANA): NEGATIVE

## 2022-03-21 LAB — C-REACTIVE PROTEIN: CRP: 25 mg/L — ABNORMAL HIGH (ref 0–10)

## 2022-03-21 LAB — T3, FREE: T3, Free: 3.1 pg/mL (ref 2.0–4.4)

## 2022-03-21 LAB — HISTAMINE DETERMINATION, BLOOD: Histamine Determination, Blood: 67 ng/mL (ref 12–127)

## 2022-03-21 LAB — VITAMIN D 25 HYDROXY (VIT D DEFICIENCY, FRACTURES): Vit D, 25-Hydroxy: 43 ng/mL (ref 30.0–100.0)

## 2022-03-21 LAB — T4, FREE: Free T4: 1.47 ng/dL (ref 0.82–1.77)

## 2022-04-03 ENCOUNTER — Encounter: Payer: Self-pay | Admitting: Family Medicine

## 2022-04-05 ENCOUNTER — Telehealth: Payer: 59 | Admitting: Family

## 2022-04-05 ENCOUNTER — Encounter: Payer: Self-pay | Admitting: Family

## 2022-04-05 ENCOUNTER — Telehealth (INDEPENDENT_AMBULATORY_CARE_PROVIDER_SITE_OTHER): Payer: 59 | Admitting: Family

## 2022-04-05 VITALS — Ht 63.0 in | Wt 147.0 lb

## 2022-04-05 DIAGNOSIS — R197 Diarrhea, unspecified: Secondary | ICD-10-CM

## 2022-04-05 MED ORDER — DICYCLOMINE HCL 10 MG PO CAPS: 10.0000 mg | ORAL_CAPSULE | Freq: Three times a day (TID) | ORAL | 0 refills | Status: DC | PRN

## 2022-04-05 NOTE — Progress Notes (Signed)
? ? ?MyChart Video Visit ? ? ? ?Virtual Visit via Video Note  ? ?This visit type was conducted due to national recommendations for restrictions regarding the COVID-19 Pandemic (e.g. social distancing) in an effort to limit this patient's exposure and mitigate transmission in our community. This patient is at least at moderate risk for complications without adequate follow up. This format is felt to be most appropriate for this patient at this time. Physical exam was limited by quality of the video and audio technology used for the visit. CMA was able to get the patient set up on a video visit. ? ?Patient location: Home. Patient and provider in visit ?Provider location: Office ? ?I discussed the limitations of evaluation and management by telemedicine and the availability of in person appointments. The patient expressed understanding and agreed to proceed. ? ?Visit Date: 04/05/2022 ? ?Today's healthcare provider: Jeanie Sewer, NP  ? ? ? ?Subjective:  ? ? Patient ID: Jeanette Rice, female    DOB: 1986-01-28, 36 y.o.   MRN: [id number] ? ?Chief Complaint  ?Patient presents with  ? GI Problem  ?  Pt c/o diarrhea for about a week. Has tried imodium which did help. But has not taken it since Tuesday and it diarrhea is worse. Would like to do a stool sample.   ? ?HPI ?Diarrhea: Patient complains of diarrhea. Onset of diarrhea was 1 week ago. Diarrhea is occurring approximately 3 times per day. Patient describes diarrhea as a week agosemisolid and watery. Diarrhea has been associated with abdominal pain described as crampy .  Patient denies blood in stool, fever, significant abdominal pain.  Previous visits for diarrhea: none. Evaluation to date: none. Treatment to date: OTC Imodium AD with mild relief. Pt reports she had been on PCN for strep throat, unsure if sx related to that, or eating spoiled food. Travelled to [place] a month ago and husband had same sx, given antibiotic and doing fine now. No cramping since  Tuesday.  ? ? ?Assessment & Plan:  ? ?Problem List Items Addressed This Visit   ?None ?Visit Diagnoses   ? ? Diarrhea, unspecified type    -  Primary  ? Reports Imodium helping, seemed like it was resolving, but then came back even worse, she is concerned because of recent travel to [place], and worried she will have ongoing problems, if an antibiotic could get rid of it. Advised to stop by office to pick up stool kit and drop back off for testing. Sending Bentyl, advised on use & SE, eating BRAT diet. ? ?Relevant Medications  ? dicyclomine (BENTYL) 10 MG capsule  ? Other Relevant Orders  ? C. difficile GDH and Toxin A/B  ? Stool culture  ? H. pylori antigen, stool  ? Ova and parasite examination  ? ?  ? ?Past Medical History:  ?Diagnosis Date  ? Allergy   ? 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 COVID-19 virus infection   ? July 2020  ? Family history of breast cancer 04/20/2021  ? Family history of colon cancer 04/20/2021  ? Family history of gene mutation in [place] and APC 04/20/2021  ? Family history of prostate cancer 04/20/2021  ? ? ?Past Surgical History:  ?Procedure Laterality Date  ? WISDOM TOOTH EXTRACTION    ? ? ?Outpatient Medications Prior to Visit  ?Medication Sig Dispense Refill  ? amphetamine-dextroamphetamine (ADDERALL) 5 MG tablet Take 1 tablet (5 mg total) by mouth daily. 30 tablet 0  ? ivermectin (STROMECTOL) 3  MG TABS tablet Take by mouth.    ? LO LOESTRIN FE 1 MG-10 MCG / 10 MCG tablet Take 1 tablet by mouth daily.    ? ?No facility-administered medications prior to visit.  ? ? ?No Known Allergies ? ?   ?Objective:  ?  ? ?Physical Exam ?Vitals and nursing note reviewed.  ?Constitutional:   ?   General: She is not in acute distress. ?   Appearance: Normal appearance.  ?HENT:  ?   Head: Normocephalic.  ?Pulmonary:  ?   Effort: No respiratory distress.  ?Musculoskeletal:  ?   Cervical back: Normal range of motion.  ?Skin: ?   General: Skin is dry.  ?   Coloration: Skin is not  pale.  ?Neurological:  ?   Mental Status: She is alert and oriented to person, place, and time.  ?Psychiatric:     ?   Mood and Affect: Mood normal.  ? ?Ht 5' 3"  (1.6 m)   Wt 147 lb (66.7 kg)   BMI 26.04 kg/m?  ? ?Wt Readings from Last 3 Encounters:  ?04/05/22 147 lb (66.7 kg)  ?03/14/22 147 lb (66.7 kg)  ?08/20/21 145 lb (65.8 kg)  ? ?   ? ?I discussed the assessment and treatment plan with the patient. The patient was provided an opportunity to ask questions and all were answered. The patient agreed with the plan and demonstrated an understanding of the instructions. ?  ?The patient was advised to call back or seek an in-person evaluation if the symptoms worsen or if the condition fails to improve as anticipated. ? ?I provided 23 minutes of face-to-face time during this encounter. ? ?Jeanie Sewer, NP ?[place] ?[phone] (phone) ?[phone] (fax) ? ?[facility]  ?

## 2022-04-08 ENCOUNTER — Other Ambulatory Visit: Payer: 59

## 2022-04-08 ENCOUNTER — Encounter: Payer: Self-pay | Admitting: Family

## 2022-04-08 DIAGNOSIS — R197 Diarrhea, unspecified: Secondary | ICD-10-CM

## 2022-04-08 NOTE — Addendum Note (Signed)
Addended by: Laddie Aquas A on: 04/08/2022 10:46 AM ? ? Modules accepted: Orders ? ?

## 2022-04-09 ENCOUNTER — Encounter: Payer: Self-pay | Admitting: Family Medicine

## 2022-04-09 ENCOUNTER — Other Ambulatory Visit: Payer: Self-pay

## 2022-04-09 ENCOUNTER — Other Ambulatory Visit: Payer: Self-pay | Admitting: Family Medicine

## 2022-04-09 LAB — C. DIFFICILE GDH AND TOXIN A/B
GDH ANTIGEN: NOT DETECTED
MICRO NUMBER:: 13396110
SPECIMEN QUALITY:: ADEQUATE
TOXIN A AND B: NOT DETECTED

## 2022-04-09 MED ORDER — DIPHENOXYLATE-ATROPINE 2.5-0.025 MG PO TABS: 1.0000 | ORAL_TABLET | Freq: Four times a day (QID) | ORAL | 0 refills | Status: DC | PRN

## 2022-04-09 MED ORDER — METRONIDAZOLE 500 MG PO TABS: 500.0000 mg | ORAL_TABLET | Freq: Three times a day (TID) | ORAL | 0 refills | Status: DC

## 2022-04-09 MED ORDER — DIPHENOXYLATE-ATROPINE 2.5-0.025 MG PO TABS
1.0000 | ORAL_TABLET | Freq: Four times a day (QID) | ORAL | 0 refills | Status: DC | PRN
Start: 2022-04-09 — End: 2022-08-27

## 2022-04-09 NOTE — Addendum Note (Signed)
Addended by: Wellington Hampshire on: 04/09/2022 05:05 PM ? ? Modules accepted: Orders ? ?

## 2022-04-09 NOTE — Telephone Encounter (Unsigned)
PT requesting solution until results come in from stool sample. ? ?Pt is requesting Rx for Lomotil.  ? ?Pt would like a call back. ?

## 2022-04-09 NOTE — Progress Notes (Signed)
Hey Katie -  ? ?Can you tell me what is going on with these results? H pylori didn't get done and we're just waiting on the others? thanks!

## 2022-04-10 ENCOUNTER — Encounter: Payer: Self-pay | Admitting: Family Medicine

## 2022-04-10 NOTE — Telephone Encounter (Signed)
please have Dr Cherlynn Kaiser address this since she sent the Flagyl in, thank you!

## 2022-04-11 ENCOUNTER — Encounter: Payer: Self-pay | Admitting: Family Medicine

## 2022-04-11 LAB — OVA AND PARASITE EXAMINATION
CONCENTRATE RESULT:: NONE SEEN
MICRO NUMBER:: 13396109
SPECIMEN QUALITY:: ADEQUATE
TRICHROME RESULT:: NONE SEEN

## 2022-04-12 LAB — STOOL CULTURE: E coli, Shiga toxin Assay: NEGATIVE

## 2022-04-17 LAB — H. PYLORI ANTIGEN, STOOL

## 2022-04-17 LAB — SPECIMEN STATUS REPORT

## 2022-05-05 DIAGNOSIS — J029 Acute pharyngitis, unspecified: Secondary | ICD-10-CM | POA: Diagnosis not present

## 2022-05-05 DIAGNOSIS — R519 Headache, unspecified: Secondary | ICD-10-CM | POA: Diagnosis not present

## 2022-05-22 ENCOUNTER — Ambulatory Visit: Payer: 59 | Admitting: Gastroenterology

## 2022-06-06 DIAGNOSIS — Z1239 Encounter for other screening for malignant neoplasm of breast: Secondary | ICD-10-CM | POA: Diagnosis not present

## 2022-06-06 DIAGNOSIS — Z1501 Genetic susceptibility to malignant neoplasm of breast: Secondary | ICD-10-CM | POA: Diagnosis not present

## 2022-06-06 DIAGNOSIS — Z1509 Genetic susceptibility to other malignant neoplasm: Secondary | ICD-10-CM | POA: Diagnosis not present

## 2022-06-06 DIAGNOSIS — Z1589 Genetic susceptibility to other disease: Secondary | ICD-10-CM | POA: Diagnosis not present

## 2022-06-06 DIAGNOSIS — Z1231 Encounter for screening mammogram for malignant neoplasm of breast: Secondary | ICD-10-CM | POA: Diagnosis not present

## 2022-06-06 DIAGNOSIS — Z803 Family history of malignant neoplasm of breast: Secondary | ICD-10-CM | POA: Diagnosis not present

## 2022-06-06 DIAGNOSIS — Z1502 Genetic susceptibility to malignant neoplasm of ovary: Secondary | ICD-10-CM | POA: Diagnosis not present

## 2022-06-27 ENCOUNTER — Encounter: Payer: Self-pay | Admitting: Family Medicine

## 2022-06-27 ENCOUNTER — Ambulatory Visit (INDEPENDENT_AMBULATORY_CARE_PROVIDER_SITE_OTHER): Payer: 59 | Admitting: Family Medicine

## 2022-06-27 VITALS — BP 110/78 | HR 78 | Temp 98.2°F | Ht 63.0 in | Wt 149.1 lb

## 2022-06-27 DIAGNOSIS — Z111 Encounter for screening for respiratory tuberculosis: Secondary | ICD-10-CM

## 2022-06-27 NOTE — Progress Notes (Signed)
   Subjective:     Patient ID: Jeanette Rice, female    DOB: 03/26/1986, 36 y.o.   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  Pt didn't need to be seen  Health Maintenance Due  Topic Date Due   PAP SMEAR-Modifier  Never done    Past Medical History:  Diagnosis Date   Allergy    COVID-19 long hauler manifesting chronic fatigue 08/20/2021   COVID-19 virus infection    July 2020   Family history of breast cancer 04/20/2021   Family history of colon cancer 04/20/2021   Family history of gene mutation in [place] and APC 04/20/2021   Family history of prostate cancer 04/20/2021    Past Surgical History:  Procedure Laterality Date   WISDOM TOOTH EXTRACTION      Outpatient Medications Prior to Visit  Medication Sig Dispense Refill   amphetamine-dextroamphetamine (ADDERALL) 5 MG tablet Take 1 tablet (5 mg total) by mouth daily. 30 tablet 0   dicyclomine (BENTYL) 10 MG capsule Take 1 capsule (10 mg total) by mouth 3 (three) times daily with meals as needed for spasms (diarrhea). 30 capsule 0   diphenoxylate-atropine (LOMOTIL) 2.5-0.025 MG tablet Take 1 tablet by mouth 4 (four) times daily as needed for diarrhea or loose stools. 5 tablet 0   ivermectin (STROMECTOL) 3 MG TABS tablet Take by mouth.     LO LOESTRIN FE 1 MG-10 MCG / 10 MCG tablet Take 1 tablet by mouth daily.     valACYclovir (VALTREX) 500 MG tablet START 1 TABLET 1 TIME A DAY FOR 3 DAYS THEN INCREASE TO 2 TABLETS 1 TIME A DAY     metroNIDAZOLE (FLAGYL) 500 MG tablet Take 1 tablet (500 mg total) by mouth 3 (three) times daily. 30 tablet 0   No facility-administered medications prior to visit.    No Known Allergies ROS neg/noncontributory except as noted HPI/below      Objective:     BP 110/78   Pulse 78   Temp 98.2 F (36.8 C) (Temporal)   Ht $R'5\' 3"'pu$  (1.6 m)   Wt 149 lb 2 oz (67.6 kg)   SpO2  99%   BMI 26.42 kg/m  Wt Readings from Last 3 Encounters:  06/27/22 149 lb 2 oz (67.6 kg)  04/05/22 147 lb (66.7 kg)  03/14/22 147 lb (66.7 kg)    Physical Exam   Gen: WDWN NAD HEENT: NCAT, conjunctiva not injected, sclera nonicteric MSK: no gross abnormalities.  NEURO: A&O x3.  CN II-XII intact.  PSYCH: normal mood. Good eye contact     Assessment & Plan:   Problem List Items Addressed This Visit   None Visit Diagnoses     Screening-pulmonary TB    -  Primary   Relevant Orders   QuantiFERON-TB Gold Plus   Erroneous encounter - disregard           No orders of the defined types were placed in this encounter.   Wellington Hampshire, MD

## 2022-06-27 NOTE — Patient Instructions (Signed)
Will send forms

## 2022-06-30 LAB — QUANTIFERON-TB GOLD PLUS
Mitogen-NIL: >10 IU/mL
NIL: 0.06 IU/mL
QuantiFERON-TB Gold Plus: NEGATIVE
TB1-NIL: <0 IU/mL
TB2-NIL: <0 IU/mL

## 2022-08-15 ENCOUNTER — Encounter: Payer: Self-pay | Admitting: Family Medicine

## 2022-08-20 DIAGNOSIS — R059 Cough, unspecified: Secondary | ICD-10-CM | POA: Diagnosis not present

## 2022-08-20 DIAGNOSIS — Z03818 Encounter for observation for suspected exposure to other biological agents ruled out: Secondary | ICD-10-CM | POA: Diagnosis not present

## 2022-08-20 DIAGNOSIS — J3489 Other specified disorders of nose and nasal sinuses: Secondary | ICD-10-CM | POA: Diagnosis not present

## 2022-08-20 DIAGNOSIS — M791 Myalgia, unspecified site: Secondary | ICD-10-CM | POA: Diagnosis not present

## 2022-08-20 DIAGNOSIS — G44209 Tension-type headache, unspecified, not intractable: Secondary | ICD-10-CM | POA: Diagnosis not present

## 2022-08-20 DIAGNOSIS — R519 Headache, unspecified: Secondary | ICD-10-CM | POA: Diagnosis not present

## 2022-08-26 ENCOUNTER — Other Ambulatory Visit: Payer: Self-pay

## 2022-08-26 DIAGNOSIS — R5383 Other fatigue: Secondary | ICD-10-CM

## 2022-08-26 DIAGNOSIS — R635 Abnormal weight gain: Secondary | ICD-10-CM

## 2022-08-27 ENCOUNTER — Ambulatory Visit: Payer: 59 | Attending: Cardiology | Admitting: Cardiology

## 2022-08-27 ENCOUNTER — Encounter: Payer: Self-pay | Admitting: Cardiology

## 2022-08-27 VITALS — BP 124/70 | Ht 63.0 in | Wt 147.6 lb

## 2022-08-27 DIAGNOSIS — R5381 Other malaise: Secondary | ICD-10-CM

## 2022-08-27 DIAGNOSIS — R0602 Shortness of breath: Secondary | ICD-10-CM

## 2022-08-27 DIAGNOSIS — I498 Other specified cardiac arrhythmias: Secondary | ICD-10-CM

## 2022-08-27 DIAGNOSIS — I491 Atrial premature depolarization: Secondary | ICD-10-CM | POA: Diagnosis not present

## 2022-08-27 NOTE — Progress Notes (Signed)
Primary Care Provider: Tawnya Crook, MD [place] Cardiologist: Glenetta Hew, MD Electrophysiologist: None  Clinic Note: Chief Complaint  Patient presents with   Follow-up    2-year follow-up.  Some exertional dyspnea chest discomfort but has had recent recurrent URI/bronchitis episodes.   ===================================  ASSESSMENT/PLAN   Problem List Items Addressed This Visit       Cardiology Problems   Premature atrial contractions - Primary (Chronic)    Zio patch few years ago showed 7% PACs.  Not very symptomatic right now.  Continue to monitor.  She did have some fast heart rate episodes noted when she had URI symptoms, but not right now. . She will continue to monitor, if these do recur, we would send her out a 7-day Zio patch monitor just to ensure that we can capture it.      Relevant Orders   EKG 12-Lead (Completed)   Arrhythmia, atrial (Chronic)    See-section for PACs        Other   Physical deconditioning (Chronic)   Shortness of breath (Chronic)    Stable.  If she continues to have these episodes we see her back in 3 months and she is otherwise healed from her bronchitis, then we would probably want to consider assessment for cardiac versus pulmonary etiology.  At that time would consider CPX.      Relevant Orders   EKG 12-Lead (Completed)   ===================================  HPI:    Jeanette Rice is a 36 y.o. female with a PMH Mom reports for several COVID-19 infections with postviral fatigue who presents today for basically 2-year follow-up for PVCs/irregular heartbeats.. She returns here today at the request of Tawnya Crook, MD.  Jeanette Rice was last seen on 09/28/2020 at the request of Dr. Melford Aase and Lazaro Arms, NP.  I saw her initially in August of that year after what I think was her second bout of COVID-19.  She had extreme fatigue as well as irregular heartbeats and palpitations.  With PVCs noted on EKG they  referred her for evaluation.  She simply noted fatigue, as well as prominent irregular heartbeats but nothing prolonged or rapid.  => This was a follow-up visit after echo and Zio patch monitor both of which are relatively reassuring.  Monitor showed for short atrial runs with rare PVCs, but frequent isolated PACs (7.2%). => See had not really noticed that much in the way of any notable palpitations for about 2 weeks.  Was gradually gaining energy level back but the slow process.  Less bothersome palpitations.  No heart failure or dyspnea symptoms.  PCP had initiated therapy for mild iron deficiency anemia. Based on symptoms being relatively stable, we decided to avoid treatment of PACs.  We did recommend follow-up every couple years to determine if there is any signs of arrhythmia.  Returns not to use beta-blocker specifically because of her fatigue.  Discussed importance of staying adequately hydrated, and avoiding triggers such as stress, anxiety, caffeine tractors and sweets.  Recent Hospitalizations: None  She has however had at least 2 more episodes of COVID-19  Reviewed  CV studies:    The following studies were reviewed today: (if available, images/films reviewed: From Epic Chart or Care Everywhere) No new studies:  Interval History:   Jeanette Rice returns for 2-year follow-up indicating that she is doing okay.  She has had 2 more bouts of COVID each time taking a while to recover.  She still notes a  little bit of exertional dyspnea and fatigue.  She has noted chest tightness associated with having pneumonia and bronchitis.  Interestingly though she has been having intermittent episodes of chest discomfort with some dyspnea since that episode and has been few months.  She does state that unusual symptoms in her chest is improving and she does not think it is probably related to her recent bronchitis.  She is also noted that whenever she has these URI symptoms that she does feel her heart  rate going up fast but she is not otherwise noticing that much in the way of any of the palpitations that we talked about before.  CV Review of Symptoms (Summary): positive for - chest pain, dyspnea on exertion, irregular heartbeat, rapid heart rate, and as noted above. negative for - edema, orthopnea, paroxysmal nocturnal dyspnea, shortness of breath, or lightheadedness hide wooziness or dizziness, syncope/near syncope, TIA/amaurosis fugax or claudication.  REVIEWED OF SYSTEMS   Review of Systems  Constitutional:  Positive for malaise/fatigue (Gradually building back to normal after her recent recurrent infections.). Negative for chills, fever and weight loss.  HENT:  Negative for nosebleeds.   Respiratory:  Positive for cough (Improving.  Associated with chest discomfort.) and shortness of breath (Summary of exertion, but notes being deconditioned.). Negative for sputum production.   Cardiovascular:  Negative for claudication.  Gastrointestinal:  Negative for blood in stool and melena.  Genitourinary:  Negative for hematuria.  Musculoskeletal:  Negative for joint pain and myalgias.  Neurological:  Negative for dizziness and focal weakness.  Psychiatric/Behavioral:  Negative for depression and memory loss. The patient is nervous/anxious. The patient does not have insomnia.    I have reviewed and (if needed) personally updated the patient's problem list, medications, allergies, past medical and surgical history, social and family history.   PAST MEDICAL HISTORY   Past Medical History:  Diagnosis Date   Allergy    COVID-19 long hauler manifesting chronic fatigue 08/20/2021   COVID-19 virus infection    July 2020   Family history of breast cancer 04/20/2021   Family history of colon cancer 04/20/2021   Family history of gene mutation in [place] and APC 04/20/2021   Family history of prostate cancer 04/20/2021    PAST SURGICAL HISTORY   Past Surgical History:  Procedure Laterality  Date   WISDOM TOOTH EXTRACTION    TTE (08/11/2020): EF 60 to 65%.  Normal LV size, function.  No R WMA.  Normal diastolic function.  Normal RV size and function.  Mild biatrial enlargement.  Normal valves.  Mildly elevated right atrial pressures 7-day Zio patch monitor (9/8-15/2021): Frequent isolated PACs (7.2% with rare couplets and triplets.  Rare PVCs with one episode of bigeminy.  4 episodes of PAT noted ranging from 4 beats at 156 bpm up to 9 beats-132 bpm.  Relatively benign.  Immunization History  Administered Date(s) Administered   Influenza Inj Mdck Quad Pf 08/31/2018   Influenza, Seasonal, Injecte, Preservative Fre 09/12/2016   Influenza,inj,Quad PF,6+ Mos 09/02/2017   Influenza,inj,quad, With Preservative 08/31/2018   Influenza-Unspecified 09/12/2016, 09/02/2017, 08/31/2018   PFIZER(Purple Top)SARS-COV-2 Vaccination 03/23/2020, 04/14/2020   Tdap 11/25/2014, 12/01/2017    MEDICATIONS/ALLERGIES   Current Meds  Medication Sig   amphetamine-dextroamphetamine (ADDERALL) 5 MG tablet Take 1 tablet (5 mg total) by mouth daily.   ivermectin (STROMECTOL) 3 MG TABS tablet Take by mouth.   LO LOESTRIN FE 1 MG-10 MCG / 10 MCG tablet Take 1 tablet by mouth daily.    No Known  Allergies  SOCIAL HISTORY/FAMILY HISTORY   Reviewed in Epic:  Pertinent findings:  Social History   Tobacco Use   Smoking status: Never   Smokeless tobacco: Never  Vaping Use   Vaping Use: Never used  Substance Use Topics   Alcohol use: No   Drug use: No   Social History   Social History Narrative   She is originally from the [place] area and moved to [place] for her husband's job.   Lives at home with family.-Husband and 2 children ages 49 and 91.   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   Leftt-handed.   1 cup caffeine per day.    OBJCTIVE -PE, EKG, labs   Wt Readings from Last 3 Encounters:  08/27/22 147 lb 9.6 oz (67 kg)  06/27/22 149 lb 2 oz (67.6 kg)  04/05/22 147 lb (66.7 kg)    Physical  Exam: BP 124/70   Ht $R'5\' 3"'fc$  (1.6 m)   Wt 147 lb 9.6 oz (67 kg)   SpO2 98%   BMI 26.15 kg/m  Physical Exam Vitals reviewed.  Constitutional:      General: She is not in acute distress.    Appearance: Normal appearance. She is normal weight. She is not ill-appearing (Healthy-appearing.  Well-groomed.  Well-nourished.) or toxic-appearing.  HENT:     Head: Normocephalic and atraumatic.  Neck:     Vascular: No carotid bruit.  Cardiovascular:     Rate and Rhythm: Normal rate and regular rhythm. Occasional Extrasystoles are present.    Chest Wall: PMI is not displaced.     Pulses: Normal pulses.     Heart sounds: Normal heart sounds, S1 normal and S2 normal. No murmur heard.    No friction rub. No gallop.  Pulmonary:     Effort: Pulmonary effort is normal. No respiratory distress.     Breath sounds: Normal breath sounds. No wheezing or rales.  Chest:     Chest wall: No tenderness.  Musculoskeletal:        General: No swelling.     Cervical back: Normal range of motion and neck supple.     Right lower leg: No edema.     Left lower leg: No edema.  Skin:    General: Skin is warm and dry.  Neurological:     General: No focal deficit present.     Mental Status: She is alert and oriented to person, place, and time.     Gait: Gait normal.  Psychiatric:        Mood and Affect: Mood normal.        Behavior: Behavior normal.        Thought Content: Thought content normal.        Judgment: Judgment normal.     Adult ECG Report  Rate: 80 ;  Rhythm: normal sinus rhythm and left atrial enlargement.  Otherwise normal axis, normal durations. ;   Narrative Interpretation: Stable  Recent Labs:   Reviewed Lab Results  Component Value Date   CHOL 177 03/19/2022   HDL 58 03/19/2022   LDLCALC 104 (H) 03/19/2022   TRIG 78 03/19/2022   CHOLHDL 3.1 03/19/2022   Lab Results  Component Value Date   CREATININE 0.79 03/19/2022   BUN 11 03/19/2022   NA 142 03/19/2022   K 4.2 03/19/2022   CL  105 03/19/2022   CO2 21 03/19/2022      Latest Ref Rng & Units 03/19/2022    8:35 AM 05/04/2020   12:04  PM 01/22/2018    5:12 AM  CBC  WBC 3.4 - 10.8 x10E3/uL 8.3  6.5  11.1   Hemoglobin 11.1 - 15.9 g/dL 13.7  13.6  12.9   Hematocrit 34.0 - 46.6 % 41.9  41.1  38.0   Platelets 150 - 450 x10E3/uL 210  221  209     Lab Results  Component Value Date   HGBA1C 4.9 03/19/2022   Lab Results  Component Value Date   TSH 1.670 03/19/2022    ================================================== I spent a total of 23 minutes with the patient spent in direct patient consultation.  Additional time spent with chart review  / charting (studies, outside notes, etc): 15 min Total Time: 38 min  Current medicines are reviewed at length with the patient today.  (+/- concerns) N/A  Notice: This dictation was prepared with Dragon dictation along with smart phrase technology. Any transcriptional errors that result from this process are unintentional and may not be corrected upon review.  Studies Ordered:   Orders Placed This Encounter  Procedures   EKG 12-Lead   No orders of the defined types were placed in this encounter.   Patient Instructions / Medication Changes & Studies & Tests Ordered   Patient Instructions  Medication Instructions:   No changes   *If you need a refill on your cardiac medications before your next appointment, please call your pharmacy*   Lab Work: Not needed    Testing/Procedures:  Not needed  Follow-Up: At [facility], you and your health needs are our priority.  As part of our continuing mission to provide you with exceptional heart care, we have created designated Provider Care Teams.  These Care Teams include your primary Cardiologist (physician) and Advanced Practice Providers (APPs -  Physician Assistants and Nurse Practitioners) who all work together to provide you with the care you need, when you need it.     Your next appointment:   3  month(s)  The format for your next appointment:   In Person  Provider:   Glenetta Hew, MD    Other Instructions     If develop  more frequent  [place]  contact  office  - we will set you up with a 7 day Zio monitor.     Leonie Man, MD, MS Glenetta Hew, M.D., M.S. Interventional Cardiologist  [place]  Pager # [phone] Phone # [phone] [address] [place], [place] [address]   Thank you for choosing [facility]!!

## 2022-08-27 NOTE — Patient Instructions (Signed)
Medication Instructions:   No changes   *If you need a refill on your cardiac medications before your next appointment, please call your pharmacy*   Lab Work: Not needed    Testing/Procedures:  Not needed  Follow-Up: At [facility], you and your health needs are our priority.  As part of our continuing mission to provide you with exceptional heart care, we have created designated Provider Care Teams.  These Care Teams include your primary Cardiologist (physician) and Advanced Practice Providers (APPs -  Physician Assistants and Nurse Practitioners) who all work together to provide you with the care you need, when you need it.     Your next appointment:   3 month(s)  The format for your next appointment:   In Person  Provider:   Glenetta Hew, MD    Other Instructions     If develop  more frequent  [place]  contact  office  - we will set you up with a 7 day Zio monitor.

## 2022-08-29 ENCOUNTER — Ambulatory Visit: Payer: 59 | Admitting: Family

## 2022-09-13 ENCOUNTER — Encounter: Payer: Self-pay | Admitting: Cardiology

## 2022-09-13 NOTE — Assessment & Plan Note (Signed)
See-section for PACs

## 2022-09-13 NOTE — Assessment & Plan Note (Signed)
Stable.  If she continues to have these episodes we see her back in 3 months and she is otherwise healed from her bronchitis, then we would probably want to consider assessment for cardiac versus pulmonary etiology.  At that time would consider CPX.

## 2022-09-13 NOTE — Assessment & Plan Note (Signed)
Zio patch few years ago showed 7% PACs.  Not very symptomatic right now.  Continue to monitor.  She did have some fast heart rate episodes noted when she had URI symptoms, but not right now. . She will continue to monitor, if these do recur, we would send her out a 7-day Zio patch monitor just to ensure that we can capture it.

## 2022-09-30 ENCOUNTER — Ambulatory Visit: Payer: 59 | Admitting: Cardiology

## 2022-10-08 ENCOUNTER — Encounter: Payer: 59 | Admitting: Family Medicine

## 2022-10-09 ENCOUNTER — Encounter: Payer: Self-pay | Admitting: Family Medicine

## 2022-10-09 ENCOUNTER — Ambulatory Visit (INDEPENDENT_AMBULATORY_CARE_PROVIDER_SITE_OTHER): Payer: 59 | Admitting: Family Medicine

## 2022-10-09 ENCOUNTER — Telehealth: Payer: Self-pay | Admitting: Family Medicine

## 2022-10-09 VITALS — BP 110/70 | HR 82 | Temp 98.3°F | Ht 63.0 in | Wt 149.1 lb

## 2022-10-09 DIAGNOSIS — R091 Pleurisy: Secondary | ICD-10-CM

## 2022-10-09 DIAGNOSIS — R5382 Chronic fatigue, unspecified: Secondary | ICD-10-CM

## 2022-10-09 DIAGNOSIS — Z0001 Encounter for general adult medical examination with abnormal findings: Secondary | ICD-10-CM

## 2022-10-09 DIAGNOSIS — M533 Sacrococcygeal disorders, not elsewhere classified: Secondary | ICD-10-CM | POA: Diagnosis not present

## 2022-10-09 DIAGNOSIS — Z0000 Encounter for general adult medical examination without abnormal findings: Secondary | ICD-10-CM

## 2022-10-09 DIAGNOSIS — G9332 Myalgic encephalomyelitis/chronic fatigue syndrome: Secondary | ICD-10-CM | POA: Diagnosis not present

## 2022-10-09 DIAGNOSIS — R768 Other specified abnormal immunological findings in serum: Secondary | ICD-10-CM

## 2022-10-09 DIAGNOSIS — U099 Post covid-19 condition, unspecified: Secondary | ICD-10-CM

## 2022-10-09 LAB — POCT URINALYSIS DIPSTICK
Bilirubin, UA: NEGATIVE
Blood, UA: NEGATIVE
Glucose, UA: NEGATIVE
Ketones, UA: NEGATIVE
Leukocytes, UA: NEGATIVE
Nitrite, UA: NEGATIVE
Protein, UA: NEGATIVE
Spec Grav, UA: <=1.005 — AB (ref 1.010–1.025)
Urobilinogen, UA: 0.2 E.U./dL
pH, UA: 6 (ref 5.0–8.0)

## 2022-10-09 LAB — COMPREHENSIVE METABOLIC PANEL
ALT: 14 U/L (ref 0–35)
AST: 16 U/L (ref 0–37)
Albumin: 4.7 g/dL (ref 3.5–5.2)
Alkaline Phosphatase: 47 U/L (ref 39–117)
BUN: 11 mg/dL (ref 6–23)
CO2: 28 mEq/L (ref 19–32)
Calcium: 9.4 mg/dL (ref 8.4–10.5)
Chloride: 104 mEq/L (ref 96–112)
Creatinine, Ser: 0.74 mg/dL (ref 0.40–1.20)
GFR: 102.4 mL/min (ref >60.00)
Glucose, Bld: 92 mg/dL (ref 70–99)
Potassium: 4 mEq/L (ref 3.5–5.1)
Sodium: 137 mEq/L (ref 135–145)
Total Bilirubin: 0.7 mg/dL (ref 0.2–1.2)
Total Protein: 7.5 g/dL (ref 6.0–8.3)

## 2022-10-09 LAB — LIPID PANEL
Cholesterol: 203 mg/dL — ABNORMAL HIGH (ref 0–200)
HDL: 62.6 mg/dL (ref >39.00)
LDL Cholesterol: 125 mg/dL — ABNORMAL HIGH (ref 0–99)
NonHDL: 139.98
Total CHOL/HDL Ratio: 3
Triglycerides: 74 mg/dL (ref 0.0–149.0)
VLDL: 14.8 mg/dL (ref 0.0–40.0)

## 2022-10-09 LAB — TSH: TSH: 1.24 u[IU]/mL (ref 0.35–5.50)

## 2022-10-09 LAB — FOLATE: Folate: 14.6 ng/mL (ref >5.9)

## 2022-10-09 LAB — IBC + FERRITIN
Ferritin: 51 ng/mL (ref 10.0–291.0)
Iron: 214 ug/dL — ABNORMAL HIGH (ref 42–145)
Saturation Ratios: 60.7 % — ABNORMAL HIGH (ref 20.0–50.0)
TIBC: 352.8 ug/dL (ref 250.0–450.0)
Transferrin: 252 mg/dL (ref 212.0–360.0)

## 2022-10-09 LAB — VITAMIN D 25 HYDROXY (VIT D DEFICIENCY, FRACTURES): VITD: 27.57 ng/mL — ABNORMAL LOW (ref 30.00–100.00)

## 2022-10-09 LAB — C-REACTIVE PROTEIN: CRP: <1 mg/dL (ref 0.5–20.0)

## 2022-10-09 LAB — CORTISOL: Cortisol, Plasma: 9.6 ug/dL

## 2022-10-09 LAB — HEMOGLOBIN A1C: Hgb A1c MFr Bld: 5 % (ref 4.6–6.5)

## 2022-10-09 NOTE — Progress Notes (Signed)
Phone [phone]   Subjective:   Patient is a 36 y.o. female presenting for annual physical.    Chief Complaint  Patient presents with   Annual Exam    CPE Fasting Pap scheduled for 10/22/22 at gyn   Annual-some exercise.  Dad has colon polyps.  Pt has gene that predisposes to colon/breast ca. Chronic fatigue, long covid-has whole list of tests wants done-[place] ins may not cover all.  [facility]fax [phone]   See problem oriented charting- ROS- ROS: Gen: no fever, chills  Skin: no rash, itching ENT: no ear pain, ear drainage, nasal congestion, rhinorrhea, sinus pressure, sore throat Eyes: no blurry vision, double vision Resp: in sept URI and abx.  Still some insp pain on L upper pain CV: no CP, palpitations, LE edema,  was getting palp after azithromycin-saw card.  Ok.  GI: no heartburn, n/v/d/c, abd pain GU: no dysuria, hematuria, nocturia x 1.  +some freq.   Will get u/s at gyn pelvis. MSK:if sits or stands awhile, achiness on middle of lower back(sacrum). No injury.  And inc urinary freq.  Neuro:hands can go numb if holding phone. Has wrist brace.occ weakness L hand. occ tremors.  Fatigue continues.  If gets sick, takes longer to recover.  Psych: no depression, anxiety, insomnia, SI   The following were reviewed and entered/updated in epic: Past Medical History:  Diagnosis Date   Allergy    COVID-19 long hauler manifesting chronic fatigue 08/20/2021   COVID-19 virus infection    July 2020   Family history of breast cancer 04/20/2021   Family history of colon cancer 04/20/2021   Family history of gene mutation in [place] and APC 04/20/2021   Family history of prostate cancer 04/20/2021   Patient Active Problem List   Diagnosis Date Noted   COVID-19 long hauler manifesting chronic fatigue 72/62/0355   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   Genetic testing 05/14/2021   Family history of breast cancer  04/20/2021   Family history of prostate cancer 04/20/2021   Family history of colon cancer 04/20/2021   Family history of gene mutation in [place] and APC 04/20/2021   Premature atrial contractions 07/20/2020   Shortness of breath 07/20/2020   Arrhythmia, atrial 07/20/2020   History of COVID-19 05/03/2020   Fatigue 05/03/2020   Muscle pain 05/03/2020   Lower extremity pain, bilateral 05/03/2020   Physical deconditioning 05/03/2020   Paresthesia 12/14/2019   Normal labor 01/21/2018   Postpartum care following vaginal delivery 01/21/2018   Past Surgical History:  Procedure Laterality Date   WISDOM TOOTH EXTRACTION      Family History  Problem Relation Age of Onset   Cancer Mother    Breast cancer Mother        DCIS; dx 29s   Other Father        mutations in [place] and APC genes   Depression Maternal Grandmother    Cancer Maternal Grandmother    Congestive Heart Failure Maternal Grandmother 85   Breast cancer Maternal Grandmother        dx 80s   Colon cancer Maternal Grandmother 75   Hypertension Maternal Grandfather    Heart disease Maternal Grandfather    Hypertension Paternal Grandmother    Heart disease Paternal Grandmother    Cancer Paternal Grandfather    Prostate cancer Paternal Grandfather        dx 47s   Prostate cancer Other  mets? d. 63    Medications- reviewed and updated Current Outpatient Medications  Medication Sig Dispense Refill   amphetamine-dextroamphetamine (ADDERALL) 5 MG tablet Take 1 tablet (5 mg total) by mouth daily. 30 tablet 0   Ferrous Sulfate (IRON PO) Take by mouth.     ivermectin (STROMECTOL) 3 MG TABS tablet Take by mouth.     LO LOESTRIN FE 1 MG-10 MCG / 10 MCG tablet Take 1 tablet by mouth daily.     No current facility-administered medications for this visit.    Allergies-reviewed and updated No Known Allergies  Social History   Social History Narrative   She is originally from the [place] area and moved to [place] for  her husband's job.   Lives at home with family.-Husband and 2 children ages 51 and 38.   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   Leftt-handed.   1 cup caffeine per day.   Objective  Objective:  BP 110/70   Pulse 82   Temp 98.3 F (36.8 C) (Temporal)   Ht _0  (1.6 m)   Wt 149 lb 2 oz (67.6 kg)   SpO2 99%   BMI 26.42 kg/m  Physical Exam  Gen: WDWN NAD HEENT: NCAT, conjunctiva not injected, sclera nonicteric TM WNL B, OP moist, no exudates  NECK:  supple, no thyromegaly, no nodes, no carotid bruits CARDIAC: RRR, S1S2+, no murmur.  Occ ectopic beat.  DP 2+B LUNGS: CTAB. No wheezes ABDOMEN:  BS+, soft, NTND, No HSM, no masses EXT:  no edema MSK: no gross abnormalities. MS 5/5 all 4.  Some muscle tightness L traps NEURO: A&O x3.  CN II-XII intact.  PSYCH: normal mood. Good eye contact     Assessment and Plan   Health Maintenance counseling: 1. Anticipatory guidance: Patient counseled regarding regular dental exams q6 months, eye exams,  avoiding smoking and second hand smoke, limiting alcohol to 1 beverage per day, no illicit drugs.   2. Risk factor reduction:  Advised patient of need for regular exercise and diet rich and fruits and vegetables to reduce risk of heart attack and stroke. Exercise- some.  Wt Readings from Last 3 Encounters:  10/09/22 149 lb 2 oz (67.6 kg)  08/27/22 147 lb 9.6 oz (67 kg)  06/27/22 149 lb 2 oz (67.6 kg)   3. Immunizations/screenings/ancillary studies Immunization History  Administered Date(s) Administered   Influenza Inj Mdck Quad Pf 08/31/2018   Influenza, Seasonal, Injecte, Preservative Fre 09/12/2016   Influenza,inj,Quad PF,6+ Mos 09/02/2017   Influenza,inj,quad, With Preservative 08/31/2018   Influenza-Unspecified 09/12/2016, 09/02/2017, 08/31/2018   PFIZER(Purple Top)SARS-COV-2 Vaccination 03/23/2020, 04/14/2020   Tdap 11/25/2014, 12/01/2017   Health Maintenance Due  Topic Date Due   PAP SMEAR-Modifier  Never done    4. Cervical cancer  screening: getting w/gyn 5. Skin cancer screening- advised regular sunscreen use. Denies worrisome, changing, or new skin lesions.   Problem List Items Addressed This Visit       Other   Fatigue   Relevant Orders   VITAMIN D 25 Hydroxy (Vit-D Deficiency, Fractures) (Completed)   CA 125 (Previous Method)   C-reactive protein (Completed)   Folate (Completed)   IBC + Ferritin (Completed)   Cortisol (Completed)   Histamine Determination, Blood   Serotonin serum   ANA, IFA Comprehensive Panel   SARS-CoV-2 Semi-Quantitative Total Antibody, Spike   Thyroid Peroxidase Antibodies (TPO) (REFL)   C4 complement   COVID-19 long hauler manifesting chronic fatigue   Other Visit Diagnoses  Wellness examination    -  Primary   Relevant Orders   Lipid panel (Completed)   Comprehensive metabolic panel (Completed)   Hemoglobin A1c (Completed)   TSH (Completed)   Comprehensive metabolic panel   POCT urinalysis dipstick (Completed)   Pleurisy       Relevant Orders   DG Chest 2 View   Sacral pain          Wellness-anticipatory guidance.  Work on [employer].  Check CBC,CMP,lipids,TSH, A1C.  F/u 1 yr  Pleuritic pain left shoulder area-not sure if postinfectious, pleurisy, musculoskeletal from coughing.  Will send for chest x-ray.  May need other studies. Sacral pain-Will check UA.  She will be getting an ultrasound of the pelvis at GYN in 1 to 2 weeks.  Some urinary frequency as well.  She will contact us if things continue or no definitive answers from GYN. Chronic fatigue-May be chronic fatigue syndrome, long COVID, other.  Has had extensive work-up.  Patient is requesting several labs to be run again.  Advised that insurance may not cover all of this.  Please see list of labs Long COVID syndrome-patient is very frustrated that things are not improving.  [facility] is [facility].  They told her she needs a referral from [place].  Will do referral.  Recommended follow up: annual  Return in about 1 year (around 10/10/2023) for annual. Future Appointments  Date Time Provider [place]  10/28/2022  9:30 AM Marcial Pacas, MD [facility] None  12/23/2022  9:00 AM Leonie Man, MD [facility] None  10/14/2023  8:30 AM Tawnya Crook, MD [facility]    Lab/Order associations:+ fasting   ICD-10-CM   1. Wellness examination  Z00.00 Lipid panel    Comprehensive metabolic panel    Hemoglobin A1c    TSH    Comprehensive metabolic panel    POCT urinalysis dipstick    2. Pleurisy  R09.1 DG Chest 2 View    3. Chronic fatigue  R53.82 VITAMIN D 25 Hydroxy (Vit-D Deficiency, Fractures)    CA 125 (Previous Method)    C-reactive protein    Folate    IBC + Ferritin    Cortisol    Histamine Determination, Blood    Serotonin serum    ANA, IFA Comprehensive Panel    SARS-CoV-2 Semi-Quantitative Total Antibody, Spike    Thyroid Peroxidase Antibodies (TPO) (REFL)    C4 complement    4. Sacral pain  M53.3     5. COVID-19 long hauler manifesting chronic fatigue  G93.32    U09.9       No orders of the defined types were placed in this encounter.    Wellington Hampshire, MD

## 2022-10-09 NOTE — Telephone Encounter (Signed)
Pt had questions regarding charges from 06/27/22. Procedure was not performed. Sent an email to [employer].

## 2022-10-09 NOTE — Patient Instructions (Signed)
It was very nice to see you today!  get X-ray/labs at [facility].  [address]  hours 8=M-F 8:30-5.  closed 12:30-1 lunch    PLEASE NOTE:  If you had any lab tests please let us know if you have not heard back within a few days. You may see your results on MyChart before we have a chance to review them but we will give you a call once they are reviewed by us. If we ordered any referrals today, please let us know if you have not heard from their office within the next week.   Please try these tips to maintain a healthy lifestyle:  Eat most of your calories during the day when you are active. Eliminate processed foods including packaged sweets (pies, cakes, cookies), reduce intake of potatoes, white bread, white pasta, and white rice. Look for whole grain options, oat flour or almond flour.  Each meal should contain half fruits/vegetables, one quarter protein, and one quarter carbs (no bigger than a computer mouse).  Cut down on sweet beverages. This includes juice, soda, and sweet tea. Also watch fruit intake, though this is a healthier sweet option, it still contains natural sugar! Limit to 3 servings daily.  Drink at least 1 glass of water with each meal and aim for at least 8 glasses per day  Exercise at least 150 minutes every week.   

## 2022-10-10 ENCOUNTER — Other Ambulatory Visit (INDEPENDENT_AMBULATORY_CARE_PROVIDER_SITE_OTHER): Payer: 59

## 2022-10-10 DIAGNOSIS — Z0000 Encounter for general adult medical examination without abnormal findings: Secondary | ICD-10-CM

## 2022-10-10 LAB — CBC WITH DIFFERENTIAL/PLATELET
Basophils Absolute: 0.1 10*3/uL (ref 0.0–0.1)
Basophils Relative: 0.8 % (ref 0.0–3.0)
Eosinophils Absolute: 0 10*3/uL (ref 0.0–0.7)
Eosinophils Relative: 0.7 % (ref 0.0–5.0)
HCT: 41.1 % (ref 36.0–46.0)
Hemoglobin: 13.8 g/dL (ref 12.0–15.0)
Lymphocytes Relative: 22 % (ref 12.0–46.0)
Lymphs Abs: 1.5 10*3/uL (ref 0.7–4.0)
MCHC: 33.5 g/dL (ref 30.0–36.0)
MCV: 97.1 fl (ref 78.0–100.0)
Monocytes Absolute: 0.3 10*3/uL (ref 0.1–1.0)
Monocytes Relative: 3.9 % (ref 3.0–12.0)
Neutro Abs: 5.1 10*3/uL (ref 1.4–7.7)
Neutrophils Relative %: 72.6 % (ref 43.0–77.0)
Platelets: 246 10*3/uL (ref 150.0–400.0)
RBC: 4.23 Mil/uL (ref 3.87–5.11)
RDW: 12.7 % (ref 11.5–15.5)
WBC: 7 10*3/uL (ref 4.0–10.5)

## 2022-10-10 NOTE — Telephone Encounter (Signed)
Patient called to ensure message below was received.   Patient states: -She is unsure if WBC and RBC were included in lab tests yesterday but can't imagine why they wouldn't be   Patient requests: -If WBC and RBC wasn't included in labs yesterday that PCP send in a new lab and use sample the lab obtained on 10/09/22

## 2022-10-10 NOTE — Telephone Encounter (Signed)
Message sent to pcp this morning, waiting for reply.

## 2022-10-10 NOTE — Progress Notes (Signed)
Labs thus far ok except vitamin d a little low. If taking it, add 1000 iu/d more.  Iron high-stop taking iron

## 2022-10-10 NOTE — Addendum Note (Signed)
Addended by: Angelena Sole on: 10/10/2022 12:23 PM   Modules accepted: Orders

## 2022-10-11 LAB — HISTAMINE DETERMINATION, BLOOD: Histamine Determination, Blood: 33 ng/mL (ref 12–127)

## 2022-10-11 LAB — SARS-COV-2 SEMI-QUANTITATIVE TOTAL ANTIBODY, SPIKE: SARS COV2 AB, Total Spike Semi QN: 1997 U/mL — ABNORMAL HIGH (ref <0.8)

## 2022-10-15 LAB — ANA, IFA COMPREHENSIVE PANEL
Anti Nuclear Antibody (ANA): POSITIVE — AB
ENA SM Ab Ser-aCnc: <1 AI
SM/RNP: <1 AI
SSA (Ro) (ENA) Antibody, IgG: <1 AI
SSB (La) (ENA) Antibody, IgG: <1 AI
Scleroderma (Scl-70) (ENA) Antibody, IgG: <1 AI
ds DNA Ab: 5 IU/mL — ABNORMAL HIGH

## 2022-10-15 LAB — ANTI-NUCLEAR AB-TITER (ANA TITER): ANA Titer 1: 1:160 {titer} — ABNORMAL HIGH

## 2022-10-15 LAB — C4 COMPLEMENT: C4 Complement: 27 mg/dL (ref 15–57)

## 2022-10-15 LAB — THYROID PEROXIDASE ANTIBODIES (TPO) (REFL): Thyroperoxidase Ab SerPl-aCnc: 4 IU/mL (ref <9)

## 2022-10-15 LAB — SEROTONIN SERUM

## 2022-10-16 ENCOUNTER — Telehealth: Payer: Self-pay | Admitting: Family Medicine

## 2022-10-16 NOTE — Telephone Encounter (Signed)
Patient states she was returning a call she got on 10/15/22. States she believes it was in regards to her labs but she wasn't sure.   Patient asked about long covid haul referral and I was able to inform her that this has been placed and faxed over to the correct parties. Pt verbalized understanding.   Patient requests a call back on Monday in regards to her missed call on 11/21.

## 2022-10-21 NOTE — Telephone Encounter (Signed)
Please see both messages and advise.

## 2022-10-21 NOTE — Telephone Encounter (Signed)
Spoke to pt told her Ca-125 was not done with lab work can have done at [facility] office. Also Dr. Ruthine Dose said to Repeat iron in 2-3 months and make sure no extra iron in vitamins or anything else. Pt verbalized understanding. Told her just need lab appt only and orders are already in for you. Pt verbalized understanding. In regards to Rheumatology referral you can contact them yourself cause you are already an established patient. Pt said she does not want to go back to [facility] would like a referral for [facility]. Told her okay will place referral and someone will contact you to schedule an appt. Pt verbalized understanding. Referral placed in Epic.

## 2022-10-23 DIAGNOSIS — Z1389 Encounter for screening for other disorder: Secondary | ICD-10-CM | POA: Diagnosis not present

## 2022-10-23 DIAGNOSIS — R69 Illness, unspecified: Secondary | ICD-10-CM | POA: Diagnosis not present

## 2022-10-23 DIAGNOSIS — Z6824 Body mass index (BMI) 24.0-24.9, adult: Secondary | ICD-10-CM | POA: Diagnosis not present

## 2022-10-23 DIAGNOSIS — Z8041 Family history of malignant neoplasm of ovary: Secondary | ICD-10-CM | POA: Diagnosis not present

## 2022-10-23 DIAGNOSIS — Z01419 Encounter for gynecological examination (general) (routine) without abnormal findings: Secondary | ICD-10-CM | POA: Diagnosis not present

## 2022-10-28 ENCOUNTER — Ambulatory Visit: Payer: 59 | Admitting: Neurology

## 2022-12-19 ENCOUNTER — Ambulatory Visit: Payer: 59 | Admitting: Neurology

## 2022-12-20 DIAGNOSIS — J029 Acute pharyngitis, unspecified: Secondary | ICD-10-CM | POA: Diagnosis not present

## 2022-12-20 DIAGNOSIS — R030 Elevated blood-pressure reading, without diagnosis of hypertension: Secondary | ICD-10-CM | POA: Diagnosis not present

## 2022-12-20 DIAGNOSIS — Z681 Body mass index (BMI) 19 or less, adult: Secondary | ICD-10-CM | POA: Diagnosis not present

## 2022-12-23 ENCOUNTER — Ambulatory Visit: Payer: 59 | Admitting: Cardiology

## 2023-01-16 DIAGNOSIS — D2261 Melanocytic nevi of right upper limb, including shoulder: Secondary | ICD-10-CM | POA: Diagnosis not present

## 2023-01-16 DIAGNOSIS — L814 Other melanin hyperpigmentation: Secondary | ICD-10-CM | POA: Diagnosis not present

## 2023-01-16 DIAGNOSIS — D2271 Melanocytic nevi of right lower limb, including hip: Secondary | ICD-10-CM | POA: Diagnosis not present

## 2023-01-16 DIAGNOSIS — D2222 Melanocytic nevi of left ear and external auricular canal: Secondary | ICD-10-CM | POA: Diagnosis not present

## 2023-01-16 DIAGNOSIS — D2262 Melanocytic nevi of left upper limb, including shoulder: Secondary | ICD-10-CM | POA: Diagnosis not present

## 2023-01-16 DIAGNOSIS — D2272 Melanocytic nevi of left lower limb, including hip: Secondary | ICD-10-CM | POA: Diagnosis not present

## 2023-01-16 DIAGNOSIS — D225 Melanocytic nevi of trunk: Secondary | ICD-10-CM | POA: Diagnosis not present

## 2023-01-20 ENCOUNTER — Other Ambulatory Visit: Payer: Self-pay | Admitting: *Deleted

## 2023-01-20 ENCOUNTER — Other Ambulatory Visit: Payer: 59

## 2023-01-20 DIAGNOSIS — E7800 Pure hypercholesterolemia, unspecified: Secondary | ICD-10-CM | POA: Diagnosis not present

## 2023-01-20 DIAGNOSIS — R5383 Other fatigue: Secondary | ICD-10-CM

## 2023-01-20 DIAGNOSIS — E559 Vitamin D deficiency, unspecified: Secondary | ICD-10-CM

## 2023-01-20 DIAGNOSIS — R5382 Chronic fatigue, unspecified: Secondary | ICD-10-CM | POA: Diagnosis not present

## 2023-01-20 DIAGNOSIS — Z8616 Personal history of COVID-19: Secondary | ICD-10-CM | POA: Diagnosis not present

## 2023-01-20 DIAGNOSIS — R091 Pleurisy: Secondary | ICD-10-CM

## 2023-01-22 LAB — C-REACTIVE PROTEIN: CRP: 2 mg/L (ref 0–10)

## 2023-01-22 LAB — IRON,TIBC AND FERRITIN PANEL
Ferritin: 75 ng/mL (ref 15–150)
Iron Saturation: 55 % (ref 15–55)
Iron: 194 ug/dL — ABNORMAL HIGH (ref 27–159)
Total Iron Binding Capacity: 351 ug/dL (ref 250–450)
UIBC: 157 ug/dL (ref 131–425)

## 2023-01-22 LAB — VITAMIN D 25 HYDROXY (VIT D DEFICIENCY, FRACTURES): Vit D, 25-Hydroxy: 60.8 ng/mL (ref 30.0–100.0)

## 2023-01-22 LAB — VITAMIN B12

## 2023-01-23 ENCOUNTER — Encounter: Payer: Self-pay | Admitting: Family Medicine

## 2023-01-23 NOTE — Progress Notes (Signed)
1.  Some of the labs did not get done as they were ordered almost 1 year ago.  Iron levels have improved some, but still elevated.  Make sure not taking any extra iron or vitamin with iron.  We can repeat levels in 2 months or refer to hematology if she wants to go that route. 2.  Vitamin D is fine.  Make sure not taking extra vitamin D 3.  CRP is fine-no need to keep checking

## 2023-01-24 ENCOUNTER — Telehealth: Payer: Self-pay | Admitting: Oncology

## 2023-01-24 ENCOUNTER — Other Ambulatory Visit: Payer: Self-pay | Admitting: *Deleted

## 2023-01-24 DIAGNOSIS — R5383 Other fatigue: Secondary | ICD-10-CM

## 2023-01-24 DIAGNOSIS — E611 Iron deficiency: Secondary | ICD-10-CM

## 2023-01-24 NOTE — Telephone Encounter (Signed)
Scheduled appt per 3/1 referral. Pt is aware of appt date and time. Pt is aware to arrive 15 mins prior to appt time and to bring and updated insurance card. Pt is aware of appt location.   ?

## 2023-01-28 ENCOUNTER — Other Ambulatory Visit: Payer: Self-pay | Admitting: *Deleted

## 2023-01-28 DIAGNOSIS — R635 Abnormal weight gain: Secondary | ICD-10-CM

## 2023-01-28 DIAGNOSIS — R5383 Other fatigue: Secondary | ICD-10-CM

## 2023-01-28 NOTE — Telephone Encounter (Signed)
Referral to endo placed.

## 2023-01-31 ENCOUNTER — Telehealth: Payer: Self-pay | Admitting: Family Medicine

## 2023-01-31 NOTE — Telephone Encounter (Signed)
Per 3/8 IB cancelled appointment.

## 2023-02-04 ENCOUNTER — Other Ambulatory Visit: Payer: Self-pay | Admitting: *Deleted

## 2023-02-04 ENCOUNTER — Ambulatory Visit: Payer: 59 | Admitting: Neurology

## 2023-02-04 DIAGNOSIS — R635 Abnormal weight gain: Secondary | ICD-10-CM

## 2023-02-04 DIAGNOSIS — R5382 Chronic fatigue, unspecified: Secondary | ICD-10-CM

## 2023-02-04 DIAGNOSIS — R5383 Other fatigue: Secondary | ICD-10-CM

## 2023-02-08 ENCOUNTER — Encounter: Payer: 59 | Admitting: Oncology

## 2023-03-31 ENCOUNTER — Encounter: Payer: Self-pay | Admitting: Neurology

## 2023-03-31 ENCOUNTER — Ambulatory Visit: Payer: 59 | Admitting: Neurology

## 2023-03-31 ENCOUNTER — Telehealth: Payer: Self-pay | Admitting: Neurology

## 2023-03-31 VITALS — BP 121/83 | HR 92 | Ht 63.0 in | Wt 147.0 lb

## 2023-03-31 DIAGNOSIS — M545 Low back pain, unspecified: Secondary | ICD-10-CM

## 2023-03-31 DIAGNOSIS — R202 Paresthesia of skin: Secondary | ICD-10-CM | POA: Diagnosis not present

## 2023-03-31 DIAGNOSIS — U099 Post covid-19 condition, unspecified: Secondary | ICD-10-CM | POA: Diagnosis not present

## 2023-03-31 DIAGNOSIS — Z8616 Personal history of COVID-19: Secondary | ICD-10-CM | POA: Diagnosis not present

## 2023-03-31 DIAGNOSIS — G8929 Other chronic pain: Secondary | ICD-10-CM | POA: Diagnosis not present

## 2023-03-31 DIAGNOSIS — G9332 Myalgic encephalomyelitis/chronic fatigue syndrome: Secondary | ICD-10-CM | POA: Diagnosis not present

## 2023-03-31 NOTE — Progress Notes (Signed)
Chief Complaint  Patient presents with   New Patient (Initial Visit)    Rm 14, alone PARASTHESIA ongoing longterm, feels fatigued, extremely heavy pt stated she would like to repeat mri from 3 years ago      ASSESSMENT AND PLAN  Jeanette Rice is a 37 y.o. female  Constellation of complaints, paresthesia, fatigue,  Previous normal MRI of the brain, normal neurological examinations,  Laboratory evaluation to rule out inflammatory process, thyroid malfunction,  Continue follow-up with her primary care physician   DIAGNOSTIC DATA (LABS, IMAGING, TESTING) - I reviewed patient records, labs, notes, testing and imaging myself where available.   MEDICAL HISTORY:  Jeanette Rice is a 37 year old female, seen in request by her primary care doctor Jeani Sow for evaluation of constellation of complaints,   I reviewed and summarized the referring note. PMHX. Weight loss, on Mounjaro since April 2024  She was patient of Dr. 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  PHYSICAL EXAMNIATION:  Gen: NAD, conversant, well  nourised, well groomed                     Cardiovascular: Regular rate rhythm, no peripheral edema, warm, nontender. Eyes: Conjunctivae clear without exudates or hemorrhage Neck: Supple, no carotid bruits. Pulmonary: Clear to auscultation bilaterally   NEUROLOGICAL EXAM:  MENTAL STATUS: Speech/cognition: Awake, alert, oriented to history taking and casual conversation CRANIAL NERVES: CN II: Visual fields are full to confrontation. Pupils are round equal and briskly reactive to light. CN III, IV, VI: extraocular movement are normal. No ptosis. CN V: Facial sensation is intact to light touch CN VII: Face is symmetric with normal eye closure  CN VIII: Hearing is normal to causal conversation. CN IX, X: Phonation is normal. CN XI: Head turning and shoulder shrug are intact  MOTOR: There is no pronator drift of out-stretched arms. Muscle bulk and tone are normal. Muscle strength is normal.  REFLEXES: Reflexes are 2+ and symmetric at the biceps, triceps, knees, and ankles. Plantar responses are flexor.  SENSORY: Intact to light touch, pinprick and vibratory sensation are intact in fingers and toes.  COORDINATION: There is no trunk or limb dysmetria noted.  GAIT/STANCE: Posture is normal. Gait is steady with normal steps, base, arm swing, and turning. Heel and toe walking are normal. Tandem gait is normal.  Romberg is absent.  REVIEW OF SYSTEMS:  Full 14 system review of systems performed and notable only for as above All other review of systems were negative.   ALLERGIES: No Known Allergies  HOME MEDICATIONS: Current Outpatient Medications  Medication Sig Dispense Refill  Ferrous Sulfate (IRON PO) Take by mouth.     ivermectin (STROMECTOL) 3 MG TABS tablet Take by mouth.     LO LOESTRIN FE 1 MG-10 MCG / 10 MCG tablet Take 1 tablet by mouth daily.     tirzepatide [facility]) 2.5 MG/0.5ML Pen Inject 2.5 mg into the skin once a week.     No current facility-administered  medications for this visit.    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   COVID-19 virus infection    July 2020   Family history of breast cancer 04/20/2021   Family history of colon cancer 04/20/2021   Family history of gene mutation in CHEK2 and APC 04/20/2021   Family history of prostate cancer 04/20/2021    PAST SURGICAL HISTORY: Past Surgical History:  Procedure Laterality Date   WISDOM TOOTH EXTRACTION      FAMILY HISTORY: Family History  Problem Relation Age of Onset   Cancer Mother    Breast cancer Mother        DCIS; dx 40s   Other Father        mutations in CHEK2 and APC genes   Depression Maternal Grandmother    Cancer Maternal Grandmother    Congestive Heart Failure Maternal Grandmother 40   Breast cancer Maternal Grandmother        dx 80s   Colon cancer Maternal Grandmother 75   Hypertension Maternal Grandfather    Heart disease Maternal Grandfather    Hypertension Paternal Grandmother    Heart disease Paternal Grandmother    Cancer Paternal Grandfather    Prostate cancer Paternal Grandfather        dx 19s   Prostate cancer Other        mets? d. 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   Lives at home with family.-Husband and 2 children ages 2 and 5.   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   Leftt-handed.   1 cup caffeine per day.   Social Determinants of Health   Financial Resource Strain: Not on file  Food Insecurity: Not on file  Transportation Needs: Not on file  Physical Activity: Not on file  Stress: Not on file  Social Connections: Not on file  Intimate Partner Violence: Not on file      Levert Feinstein, M.D. Ph.D.  [facility] [address] [place], [place] [address] Ph: [phone] Fax: [phone]  CC:  Jeani Sow, MD [address] [place],  [place] [address]  Jeani Sow, MD

## 2023-03-31 NOTE — Telephone Encounter (Signed)
sent to GI they obtain [employer] auth [id number] 

## 2023-04-01 ENCOUNTER — Encounter: Payer: Self-pay | Admitting: Family Medicine

## 2023-04-01 LAB — THYROID PANEL WITH TSH: TSH: 0.887 u[IU]/mL (ref 0.450–4.500)

## 2023-04-01 LAB — CK: Total CK: 119 U/L (ref 32–182)

## 2023-04-01 LAB — ANA+ENA+DNA/DS+SCL 70+SJOSSA/B: Scleroderma (Scl-70) (ENA) Antibody, IgG: <0.2 AI (ref 0.0–0.9)

## 2023-04-03 LAB — ANA+ENA+DNA/DS+SCL 70+SJOSSA/B
ANA Titer 1: POSITIVE — AB
ENA RNP Ab: <0.2 AI (ref 0.0–0.9)
ENA SM Ab Ser-aCnc: <0.2 AI (ref 0.0–0.9)
ENA SSA (RO) Ab: <0.2 AI (ref 0.0–0.9)
ENA SSB (LA) Ab: <0.2 AI (ref 0.0–0.9)
dsDNA Ab: 5 IU/mL (ref 0–9)

## 2023-04-03 LAB — THYROID PANEL WITH TSH
Free Thyroxine Index: 2.6 (ref 1.2–4.9)
T3 Uptake Ratio: 28 % (ref 24–39)
T4, Total: 9.2 ug/dL (ref 4.5–12.0)

## 2023-04-03 LAB — FANA STAINING PATTERNS: Speckled Pattern: 1:160 {titer} — ABNORMAL HIGH

## 2023-04-03 LAB — SEDIMENTATION RATE: Sed Rate: 2 mm/hr (ref 0–32)

## 2023-04-03 LAB — RPR: RPR Ser Ql: NONREACTIVE

## 2023-04-06 ENCOUNTER — Encounter: Payer: Self-pay | Admitting: Neurology

## 2023-04-08 ENCOUNTER — Encounter: Payer: Self-pay | Admitting: Neurology

## 2023-04-14 ENCOUNTER — Encounter: Payer: Self-pay | Admitting: Neurology

## 2023-04-14 NOTE — Telephone Encounter (Addendum)
MRI lumbar spine was denied by her insurance stating that she needs to try 6 weeks of physical therapy first to see if her lower back pain improves. PT needs to be done within the last three months without improvement of symptoms.Magnus Ivan ref: [id number]. I will give the appeal information to the pod if you would like to do that or we can try to get the MRI approved after she completes PT.  I sent patient [facility] message with above info. She should contact us if want to proceed with PT  Levert Feinstein, M.D. Ph.D.  [facility] [address] [place], [place] [address] Phone: [phone] Fax:      [phone]

## 2023-04-15 ENCOUNTER — Telehealth: Payer: Self-pay

## 2023-04-15 DIAGNOSIS — G8929 Other chronic pain: Secondary | ICD-10-CM

## 2023-04-15 DIAGNOSIS — R202 Paresthesia of skin: Secondary | ICD-10-CM

## 2023-04-15 NOTE — Telephone Encounter (Signed)
Per Dr. Terrace Arabia the patient needs physical therapy prior to approval of MRI. Refer to PT and let patient know.

## 2023-05-05 ENCOUNTER — Other Ambulatory Visit: Payer: Self-pay | Admitting: *Deleted

## 2023-05-05 ENCOUNTER — Other Ambulatory Visit: Payer: 59

## 2023-05-05 ENCOUNTER — Telehealth: Payer: Self-pay | Admitting: Family Medicine

## 2023-05-05 DIAGNOSIS — E611 Iron deficiency: Secondary | ICD-10-CM

## 2023-05-05 NOTE — Telephone Encounter (Signed)
Patient notified of message below.

## 2023-05-05 NOTE — Telephone Encounter (Signed)
Spoke to patient and she stated that she was going to hematology, but canceled the appointment. Would like to have labs done now since it has been about 4 months to see if her numbers are any better. Orders placed.

## 2023-05-05 NOTE — Telephone Encounter (Signed)
Patient scheduled appt.

## 2023-05-05 NOTE — Telephone Encounter (Signed)
Patient requests Lab Orders for Iron/Ferritin, CBC  & Metabolic Panel

## 2023-05-07 ENCOUNTER — Other Ambulatory Visit: Payer: 59

## 2023-05-08 ENCOUNTER — Encounter: Payer: Self-pay | Admitting: Cardiology

## 2023-05-14 ENCOUNTER — Ambulatory Visit (INDEPENDENT_AMBULATORY_CARE_PROVIDER_SITE_OTHER): Payer: 59 | Admitting: Family Medicine

## 2023-05-14 VITALS — BP 108/75 | HR 87 | Temp 98.2°F | Resp 16 | Ht 63.0 in | Wt 140.4 lb

## 2023-05-14 DIAGNOSIS — R790 Abnormal level of blood mineral: Secondary | ICD-10-CM

## 2023-05-14 DIAGNOSIS — F410 Panic disorder [episodic paroxysmal anxiety] without agoraphobia: Secondary | ICD-10-CM | POA: Diagnosis not present

## 2023-05-14 DIAGNOSIS — F419 Anxiety disorder, unspecified: Secondary | ICD-10-CM

## 2023-05-14 DIAGNOSIS — R197 Diarrhea, unspecified: Secondary | ICD-10-CM | POA: Diagnosis not present

## 2023-05-14 DIAGNOSIS — R002 Palpitations: Secondary | ICD-10-CM | POA: Diagnosis not present

## 2023-05-14 DIAGNOSIS — F411 Generalized anxiety disorder: Secondary | ICD-10-CM | POA: Diagnosis not present

## 2023-05-14 DIAGNOSIS — R5382 Chronic fatigue, unspecified: Secondary | ICD-10-CM | POA: Diagnosis not present

## 2023-05-14 MED ORDER — CLONAZEPAM 0.5 MG PO TABS: 0.5000 mg | ORAL_TABLET | Freq: Every day | ORAL | 0 refills | Status: AC | PRN

## 2023-05-14 NOTE — Progress Notes (Signed)
Subjective:     Patient ID: Jeanette Rice, female    DOB: 07/03/1986, 37 y.o.   MRN: [id number]  Chief Complaint  Patient presents with   Follow-up    Follow-up to discuss doing blood work    Diarrhea    Patient stated she has been using the bathroom since yesterday, not sure if it is something she ate    HPI F/u labs.  Iron levels have been a little high.  On OCP but no periods  not taking iron.  Needs repeat labs.  .    Diarrhea since yesterday.- 2 hrs after Poke bowl.   Few x/day.  No pain.  No f/c.   Just doesn't want to let go on for weeks.  Fatigue-occ takes ivermectin if feels poorly-helps.   Taking compounded tirzepatide -helping-feeling better.    Pt wants to get genesight test as anxiety, etc.  Wants test. May consider meds.  Does get anxious at times  There are no preventive care reminders to display for this patient.  Past Medical History:  Diagnosis Date   Allergy    COVID-19 long hauler manifesting chronic fatigue 08/20/2021   COVID-19 virus infection    July 2020   Family history of breast cancer 04/20/2021   Family history of colon cancer 04/20/2021   Family history of gene mutation in CHEK2 and APC 04/20/2021   Family history of prostate cancer 04/20/2021    Past Surgical History:  Procedure Laterality Date   WISDOM TOOTH EXTRACTION       Current Outpatient Medications:    ivermectin (STROMECTOL) 3 MG TABS tablet, Take by mouth., Disp: , Rfl:    LO LOESTRIN FE 1 MG-10 MCG / 10 MCG tablet, Take 1 tablet by mouth daily., Disp: , Rfl:    tirzepatide (MOUNJARO) 2.5 MG/0.5ML Pen, Inject 2.5 mg into the skin once a week., Disp: , Rfl:    clonazePAM (KLONOPIN) 0.5 MG tablet, Take 1 tablet (0.5 mg total) by mouth daily as needed for anxiety., Disp: 10 tablet, Rfl: 0  No Known Allergies ROS neg/noncontributory except as noted HPI/below  Occ palpitations/stress. Clonazepam occ. HR 39-120-in ams only.  90-100 sitting.DOE at times.  Seeing Card.  Dad has  afib.  Makes her anxious.  No SI      Objective:     BP 108/75   Pulse 87   Temp 98.2 F (36.8 C) (Temporal)   Resp 16   Ht 5\' 3"  (1.6 m)   Wt 140 lb 6 oz (63.7 kg)   SpO2 97%   BMI 24.87 kg/m  Wt Readings from Last 3 Encounters:  05/14/23 140 lb 6 oz (63.7 kg)  03/31/23 147 lb (66.7 kg)  10/09/22 149 lb 2 oz (67.6 kg)    Physical Exam   Gen: WDWN NAD HEENT: NCAT, conjunctiva not injected, sclera nonicteric NECK:  supple, no thyromegaly, no nodes, no carotid bruits CARDIAC: RRR, S1S2+, no murmur. DP 2+B LUNGS: CTAB. No wheezes ABDOMEN:  BS+, soft, NTND, No HSM, no masses EXT:  no edema MSK: no gross abnormalities.  NEURO: A&O x3.  CN II-XII intact.  PSYCH: normal mood. Good eye contact     Assessment & Plan:  Chronic fatigue -     CBC with Differential/Platelet; Future -     Comprehensive metabolic panel; Future -     Iron, TIBC and Ferritin Panel; Future -     Vitamin B12; Future  Abnormal blood level of iron -  CBC with Differential/Platelet; Future -     Iron, TIBC and Ferritin Panel; Future -     Vitamin B12; Future  Diarrhea, unspecified type  Palpitations  Anxiety  Other orders -     clonazePAM; Take 1 tablet (0.5 mg total) by mouth daily as needed for anxiety.  Dispense: 10 tablet; Refill: 0  1  chronic fatigue since covid-pt on meds.  Check labs 2  elevated iron-check labs.  Not on supplements 3.  Diarrhea-suspect from food she ate yesterday.  Probably more food poisoning.  For now, drink fluids and monitor.  If getting worse, she will call as she had problems when she got back from [place] and lasted a long time.  Will consider antibiotics if not resolving or worsening early next week. 4.  Palpitations-patient concerned as family history of A-fib.  Also, her heart rate is ranging from 39-1 20s.  She does have an appointment with cardiology. 5.  Anxiety-patient has concerns about health, life.  Getting palpitations which increased anxiety.  She  has a bottle of clonazepam from several years ago.  She would like a refill.  PDMP checked.  Discussed that I do not want her using this frequently.  Also, patient is considering medications for anxiety/chronic fatigue, etc.  She is requesting GeneSight testing to better gauge medication management.  Discussed the test.  Discussed the payment is insurance may or may not cover.  She would like to proceed.  Samples were obtained and will send.  No follow-ups on file.  Angelena Sole, MD

## 2023-05-15 ENCOUNTER — Ambulatory Visit: Payer: 59 | Admitting: Physical Therapy

## 2023-05-15 LAB — CBC WITH DIFFERENTIAL/PLATELET
Basophils Absolute: 0 10*3/uL (ref 0.0–0.2)
Basos: 1 %
EOS (ABSOLUTE): 0.1 10*3/uL (ref 0.0–0.4)
Eos: 1 %
Hematocrit: 39.9 % (ref 34.0–46.6)
Hemoglobin: 13.4 g/dL (ref 11.1–15.9)
Immature Grans (Abs): 0 10*3/uL (ref 0.0–0.1)
Immature Granulocytes: 0 %
Lymphocytes Absolute: 1.5 10*3/uL (ref 0.7–3.1)
Lymphs: 25 %
MCH: 32.4 pg (ref 26.6–33.0)
MCHC: 33.6 g/dL (ref 31.5–35.7)
MCV: 97 fL (ref 79–97)
Monocytes Absolute: 0.4 10*3/uL (ref 0.1–0.9)
Monocytes: 7 %
Neutrophils Absolute: 3.8 10*3/uL (ref 1.4–7.0)
Neutrophils: 66 %
Platelets: 237 10*3/uL (ref 150–450)
RBC: 4.13 x10E6/uL (ref 3.77–5.28)
RDW: 11.8 % (ref 11.7–15.4)
WBC: 5.8 10*3/uL (ref 3.4–10.8)

## 2023-05-15 LAB — IRON,TIBC AND FERRITIN PANEL
Ferritin: 108 ng/mL (ref 15–150)
Iron Saturation: 29 % (ref 15–55)
Iron: 92 ug/dL (ref 27–159)
Total Iron Binding Capacity: 320 ug/dL (ref 250–450)
UIBC: 228 ug/dL (ref 131–425)

## 2023-05-15 LAB — COMPREHENSIVE METABOLIC PANEL
ALT: 16 IU/L (ref 0–32)
AST: 18 IU/L (ref 0–40)
Albumin: 4.7 g/dL (ref 3.9–4.9)
Alkaline Phosphatase: 49 IU/L (ref 44–121)
BUN/Creatinine Ratio: 14 (ref 9–23)
BUN: 11 mg/dL (ref 6–20)
Bilirubin Total: 0.4 mg/dL (ref 0.0–1.2)
CO2: 22 mmol/L (ref 20–29)
Calcium: 9.2 mg/dL (ref 8.7–10.2)
Chloride: 105 mmol/L (ref 96–106)
Creatinine, Ser: 0.77 mg/dL (ref 0.57–1.00)
Globulin, Total: 2.6 g/dL (ref 1.5–4.5)
Glucose: 87 mg/dL (ref 70–99)
Potassium: 3.8 mmol/L (ref 3.5–5.2)
Sodium: 139 mmol/L (ref 134–144)
Total Protein: 7.3 g/dL (ref 6.0–8.5)
eGFR: 101 mL/min/{1.73_m2} (ref >59)

## 2023-05-15 LAB — VITAMIN B12: Vitamin B-12: 483 pg/mL (ref 232–1245)

## 2023-05-18 NOTE — Progress Notes (Unsigned)
Cardiology Clinic Note   Date: 05/20/2023 ID: Jeanette Rice, DOB [birth year], MRN [id number]  Primary Cardiologist:  Bryan Lemma, MD  Patient Profile    Jeanette Rice is a 37 y.o. female who presents to the clinic today for delayed follow up.     Past medical history significant for: Palpitations. Echo 08/11/2020: EF 60 to 65%.  Normal RV function.  Mild BAE.  No significant valvular abnormalities. Cardiac event monitor 08/21/2020: Predominant underlying rhythm sinus.  HR 55 to 143 bpm, average 81 bpm.  Frequent PACs (7.2%) with rare couplets and triplets.  Rare PVCs with 1 episode of bigeminy.  Episodes of PAT/SVT ranging from 4 beats at 156 bpm to 9 beats at 132 bpm.     History of Present Illness    Jeanette Rice was first evaluated by Dr. Herbie Baltimore on 07/20/2020 for fatigue and palpitations.  Patient noted palpitations after COVID infection.  But has noticed some rest and exertional dyspnea.  Echo showed normal LV/RV function without valvular abnormalities.  Cardiac event monitor showed frequent PACs 7.2% (details above).  Last seen in the office by Dr. Herbie Baltimore on 08/27/2022 for routine follow-up.  She reported some fast heart rate episodes in the setting of URI.  She was instructed to continue to monitor palpitations and chronic shortness of breath.  No changes were made. Would consider CPX if symptoms continued.   Today, patient is here alone. She repots labile heart rate particularly between the hours of 7a and 9a. She reports heart rate can jump from 40 bpm to 140 bpm while she is just standing in the kitchen. However, these episodes can occur during the day at random times and HR can get as high as 160.  She does not have a history of anxiety and despite normal day-to-day stress that may increase her heart rate episodes are not necessarily related to anxious feelings.  These episodes are brief lasting minutes. EKG strips captured on Apple Watch were all sinus rhythm. She denies  shortness of breath at rest but reports dyspnea with exertion. She has stopped doing longer walks for exercise secondary to the dyspnea. She also reports associated central chest tightness that only occurs occasionally with or without exertion.  Tightness will occasionally occur with palpitations/increased heart rate.  She remains active around her home and taking shorter walks for a total of 10,000 steps most days. She started tirzepatide in April and wonders if this has caused her heart rate to increase. She is concerned about afib, as her dad was recently diagnosed with Afib and underwent ablation.   ROS: All other systems reviewed and are otherwise negative except as noted in History of Present Illness.  Studies Reviewed    EKG Interpretation  Date/Time:  Tuesday May 20 2023 10:13:17 EDT Ventricular Rate:  82 PR Interval:  134 QRS Duration: 84 QT Interval:  364 QTC Calculation: 425 R Axis:   89 Text Interpretation: Normal sinus rhythm with sinus arrhythmia Normal ECG No previous ECGs available Unchanged from October 2023 (EKG not in Muse) Confirmed by Carlos Levering [phone]) on 05/20/2023 10:23:28 AM            Physical Exam    VS:  BP 112/68 (BP Location: Left Arm, Patient Position: Sitting, Cuff Size: Normal)   Pulse 82   Ht 5\' 4"  (1.626 m)   Wt 140 lb 9.6 oz (63.8 kg)   SpO2 99%   BMI 24.13 kg/m  , BMI Body mass index is 24.13  kg/m.  GEN: Well nourished, well developed, in no acute distress. Neck: No JVD or carotid bruits. Cardiac:  RRR. Occasional extrasystole. No murmurs. No rubs or gallops.   Respiratory:  Respirations regular and unlabored. Clear to auscultation without rales, wheezing or rhonchi. GI: Soft, nontender, nondistended. Extremities: Radials/DP/PT 2+ and equal bilaterally. No clubbing or cyanosis. No edema.  Skin: Warm and dry, no rash. Neuro: Strength intact.  Assessment & Plan    Palpitations.  Cardiac event monitor September 2021 showed frequent  PACs 7.2%.  Patient reports more episodes of palpitations and increased heart rate.  She states heart rate can jump from 40-140 while she is just standing in her kitchen. Episodes are brief lasting minutes. EKG strips captured on her Apple Watch show sinus rhythm. EKG today is normal sinus with sinus arrhythmia 82 bpm, no ectopy. Her palpitations are not lasting long enough for a beta blocker at this point. Patient's father was recently diagnosed with afib and underwent afib ablation. Discussed repeating Zio monitoring. She would like to hold off for the summer and consider on follow up.  Chest tightness/DOE. Patient describes brief episodes of central chest tightness with and without exertion and palpitations.  She also reports DOE with most activities.  She reports she has stopped longer walking for exercising secondary to the dyspnea.  Will get CPX for further evaluation.  Disposition: CPX. Return in 2 months or sooner as needed.          Signed, Etta Grandchild. Curlee Bogan, DNP, NP-C

## 2023-05-20 ENCOUNTER — Encounter: Payer: Self-pay | Admitting: Student

## 2023-05-20 ENCOUNTER — Ambulatory Visit: Payer: 59 | Attending: Student | Admitting: Student

## 2023-05-20 VITALS — BP 112/68 | HR 82 | Ht 64.0 in | Wt 140.6 lb

## 2023-05-20 DIAGNOSIS — I498 Other specified cardiac arrhythmias: Secondary | ICD-10-CM | POA: Diagnosis not present

## 2023-05-20 DIAGNOSIS — I491 Atrial premature depolarization: Secondary | ICD-10-CM

## 2023-05-20 DIAGNOSIS — R0609 Other forms of dyspnea: Secondary | ICD-10-CM | POA: Diagnosis not present

## 2023-05-20 DIAGNOSIS — R072 Precordial pain: Secondary | ICD-10-CM

## 2023-05-20 DIAGNOSIS — R002 Palpitations: Secondary | ICD-10-CM

## 2023-05-20 NOTE — Patient Instructions (Addendum)
Medication Instructions:   Your physician recommends that you continue on your current medications as directed. Please refer to the Current Medication list given to you today.   *If you need a refill on your cardiac medications before your next appointment, please call your pharmacy*  Lab Work: NONE ordered at this time of appointment   If you have labs (blood work) drawn today and your tests are completely normal, you will receive your results only by: MyChart Message (if you have MyChart) OR A paper copy in the mail If you have any lab test that is abnormal or we need to change your treatment, we will call you to review the results.  Testing/Procedures: Your physician has recommended that you have a cardiopulmonary stress test (CPX). CPX testing is a non-invasive measurement of heart and lung function. It replaces a traditional treadmill stress test. This type of test provides a tremendous amount of information that relates not only to your present condition but also for future outcomes. This test combines measurements of you ventilation, respiratory gas exchange in the lungs, electrocardiogram (EKG), blood pressure and physical response before, during, and following an exercise protocol.  Follow-Up: At [facility], you and your health needs are our priority.  As part of our continuing mission to provide you with exceptional heart care, we have created designated Provider Care Teams.  These Care Teams include your primary Cardiologist (physician) and Advanced Practice Providers (APPs -  Physician Assistants and Nurse Practitioners) who all work together to provide you with the care you need, when you need it.   Your next appointment:   2 month(s)  Provider:   Bryan Lemma, MD  or  APP         Other Instructions CPX TEST PATIENT INSTRUCTIONS  You are scheduled for a Cardiopulmonary Exercise (CPX) Test at [facility] on:  ____/____/____ at ____:____ am/pm.  Expect  to be in the lab for 2 hours. Please plan to arrive 30 MINUTES PRIOR to your appointment. You may be asked to reschedule your test if you arrive 20 minutes or more after your scheduled appointment time.  Main Campus address: [address] [place], [place] [address]  *You may arrive to Main Entrance A or Entrance C (Free valet parking is available at both).  - Main Entrance A (from [facility]): proceed to admitting for check-in  - Entrance C (from [facility]): proceed to [employer] parking or under hospital deck parking using this code _______________  Check-in: [facility] waiting room   General instructions for the day of the test (Please follow all instructions from your physician):   Refrain from ingesting a heavy meal, alcohol, or caffeine or using tobacco products within 2 hours of the test (DO NOT FAST for more than 8 hours). You may have all other non-alcoholic, non-caffeinated beverages a light snack (crackers, a piece of fruit, carrot sticks, toast, bagel, etc.) up to your appointment time.   Avoid significant exertion or exercise within 24 hours of your test.   Be prepared to exercise and sweat. Your clothing should permit freedom of movement and include walking or running shoes. Women bring loose-fitting, short-sleeved blouse.   This evaluation may be fatiguing, and you may wish to have someone accompany you to the assessment to drive you home afterward.   Bring a list of your medications with you, including dosage and frequency you take the medication (i.e., once per day, twice per day, etc).   Take  all medications as prescribed, unless noted below or instructed to do so by your physician.  o Please do not take the following medications prior to your CPX:  Brief description of the test:  A brief lung function test will be performed. This will involve you taking deep breaths and blowing out hard and fast through your mouth. During these, a clip will  be on your nose and you will be breathing through a breathing device.  For the exercise portion of the test you will be walking on a treadmill, or riding a stationary bike, to your maximal effort or until symptoms such as chest pain, shortness of breath, leg pain or dizziness limit your exercise. You will be breathing in and out of a breathing device through your mouth (a clip will be on your nose again). Your heart rate, ECG, blood pressure, oxygen saturation, breathing rate and depth, amount of oxygen you consume and amount of carbon dioxide you produce will be measured and monitored throughout the exercise test.  If you need to cancel or reschedule your appointment please call: [phone]  If you have any further questions please call your physician or Philip Aspen, MS, ACSM-

## 2023-06-12 ENCOUNTER — Ambulatory Visit (HOSPITAL_COMMUNITY): Payer: 59 | Attending: Student

## 2023-06-12 DIAGNOSIS — R0609 Other forms of dyspnea: Secondary | ICD-10-CM

## 2023-06-12 DIAGNOSIS — R0600 Dyspnea, unspecified: Secondary | ICD-10-CM | POA: Diagnosis not present

## 2023-06-12 DIAGNOSIS — R079 Chest pain, unspecified: Secondary | ICD-10-CM | POA: Diagnosis not present

## 2023-06-12 DIAGNOSIS — R072 Precordial pain: Secondary | ICD-10-CM | POA: Diagnosis not present

## 2023-06-12 DIAGNOSIS — U099 Post covid-19 condition, unspecified: Secondary | ICD-10-CM | POA: Diagnosis not present

## 2023-06-12 DIAGNOSIS — R5382 Chronic fatigue, unspecified: Secondary | ICD-10-CM | POA: Diagnosis not present

## 2023-07-01 DIAGNOSIS — R5383 Other fatigue: Secondary | ICD-10-CM | POA: Diagnosis not present

## 2023-07-01 DIAGNOSIS — M2550 Pain in unspecified joint: Secondary | ICD-10-CM | POA: Diagnosis not present

## 2023-07-01 DIAGNOSIS — R768 Other specified abnormal immunological findings in serum: Secondary | ICD-10-CM | POA: Diagnosis not present

## 2023-07-01 DIAGNOSIS — N911 Secondary amenorrhea: Secondary | ICD-10-CM | POA: Diagnosis not present

## 2023-07-01 DIAGNOSIS — L659 Nonscarring hair loss, unspecified: Secondary | ICD-10-CM | POA: Diagnosis not present

## 2023-07-01 DIAGNOSIS — F488 Other specified nonpsychotic mental disorders: Secondary | ICD-10-CM | POA: Diagnosis not present

## 2023-07-13 DIAGNOSIS — J029 Acute pharyngitis, unspecified: Secondary | ICD-10-CM | POA: Diagnosis not present

## 2023-07-17 DIAGNOSIS — L659 Nonscarring hair loss, unspecified: Secondary | ICD-10-CM | POA: Diagnosis not present

## 2023-07-17 DIAGNOSIS — Z9189 Other specified personal risk factors, not elsewhere classified: Secondary | ICD-10-CM | POA: Diagnosis not present

## 2023-07-17 DIAGNOSIS — R768 Other specified abnormal immunological findings in serum: Secondary | ICD-10-CM | POA: Diagnosis not present

## 2023-07-17 DIAGNOSIS — N911 Secondary amenorrhea: Secondary | ICD-10-CM | POA: Diagnosis not present

## 2023-07-17 DIAGNOSIS — R5383 Other fatigue: Secondary | ICD-10-CM | POA: Diagnosis not present

## 2023-07-18 ENCOUNTER — Telehealth: Payer: Self-pay

## 2023-07-18 ENCOUNTER — Ambulatory Visit: Payer: 59 | Attending: Internal Medicine | Admitting: Internal Medicine

## 2023-07-18 ENCOUNTER — Encounter: Payer: Self-pay | Admitting: Internal Medicine

## 2023-07-18 VITALS — BP 108/67 | HR 77 | Resp 14 | Ht 64.0 in | Wt 137.0 lb

## 2023-07-18 DIAGNOSIS — R768 Other specified abnormal immunological findings in serum: Secondary | ICD-10-CM | POA: Diagnosis not present

## 2023-07-18 DIAGNOSIS — G9332 Myalgic encephalomyelitis/chronic fatigue syndrome: Secondary | ICD-10-CM

## 2023-07-18 DIAGNOSIS — D8989 Other specified disorders involving the immune mechanism, not elsewhere classified: Secondary | ICD-10-CM | POA: Diagnosis not present

## 2023-07-18 NOTE — Telephone Encounter (Signed)
Patient contacted the office and states she went to get her AVISE labs done and she attempted to go today and had a bad experience and ended up passing out. Patient states she does not know how much blood she gave. Patient states she would like to go to [facility]. Patient inquires what she should do and it she can get labs done at [facility]. Please advise.

## 2023-07-18 NOTE — Progress Notes (Signed)
Office Visit Note  Patient: Jeanette Rice             Date of Birth: 1986/04/16           MRN: [id number]             PCP: Jeani Sow, MD Referring: Jeani Sow, MD Visit Date: 07/18/2023  Subjective:  New Patient (Initial Visit) (Patient states her body feels weak and heavy. Patient states she has some joint pain in her knees. Patient state certain things are coming from long COVID.)   History of Present Illness: Jeanette Rice is a 37 y.o. female here for evaluation of positive ANA checked associated with multiple symptoms but most prominently joint pain and severe fatigue.  Symptoms have been problematic for a few years without specific acute onset but associates at least some after COVID infections in 2020 and a few additional infections subsequently.  Most prominent issue is fatigue with difficulty staying awake despite sleeping normal or excessive amounts of time and not able to tolerate abnormal level of daily activity.  Also has some joint pain most frequently in both knees not associated with any significant swelling or range of motion.  She feels generally weaker than normal.  Symptom severity varies from day today although never really at her usual previous baseline.  She experiences brain fog with some concentration and short-term recall difficulty also worse on bad days.  Does not really have widespread pain.  Not experiencing problems with increased constipation or diarrhea. She had several previous evaluations for this saw [facility] in 2022 for positive ANA previously without specific diagnosis but my review of this was positive at 1: 320 titer in 2022 with repeat at 1: 160 titer earlier this year.  Had antibody testing consistent with previous exposure to mycoplasma, CMV, EBV.  She saw neurology with previous MRI in 2021 was normal saw Dr. Terrace Arabia earlier this year without any specific neurologic condition identified.  03/2023 ANA 1:160 homogenous dsDNA, RNP, Sm,  Scl-70, SSA, SSB neg ESR 2  12/2020 ANA 1:320 homogenous  Activities of Daily Living:  Patient Denies nocturnal pain.  Difficulty dressing/grooming: Denies Difficulty climbing stairs: Reports Difficulty getting out of chair: Denies Difficulty using hands for taps, buttons, cutlery, and/or writing: Denies  Review of Systems  Constitutional:  Positive for fatigue.  HENT:  Negative for mouth sores and mouth dryness.   Eyes:  Negative for dryness.  Respiratory:  Negative for shortness of breath.   Cardiovascular:  Positive for palpitations. Negative for chest pain.  Gastrointestinal:  Negative for blood in stool, constipation and diarrhea.  Endocrine: Negative for increased urination.  Genitourinary:  Negative for involuntary urination.  Musculoskeletal:  Positive for joint pain, joint pain, myalgias, morning stiffness, muscle tenderness and myalgias. Negative for gait problem, joint swelling and muscle weakness.  Skin:  Positive for hair loss. Negative for color change, rash and sensitivity to sunlight.  Allergic/Immunologic: Positive for susceptible to infections.  Neurological:  Positive for headaches. Negative for dizziness.  Hematological:  Negative for swollen glands.  Psychiatric/Behavioral:  Positive for sleep disturbance. Negative for depressed mood. The patient is nervous/anxious.     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   Genetic testing 05/14/2021   Family history  of breast cancer 04/20/2021   Family history of prostate cancer 04/20/2021   Family history of colon cancer 04/20/2021   Family history of gene mutation in CHEK2 and APC 04/20/2021   Premature atrial contractions 07/20/2020   Shortness of breath 07/20/2020   Arrhythmia,  atrial 07/20/2020   History of COVID-19 05/03/2020   Fatigue 05/03/2020   Muscle pain 05/03/2020   Lower extremity pain, bilateral 05/03/2020   Physical deconditioning 05/03/2020   Paresthesia 12/14/2019   Normal labor 01/21/2018   Postpartum care following vaginal delivery 01/21/2018    Past Medical History:  Diagnosis Date   Allergy    COVID-19 long hauler manifesting chronic fatigue 08/20/2021   COVID-19 virus infection    July 2020   Family history of breast cancer 04/20/2021   Family history of colon cancer 04/20/2021   Family history of gene mutation in CHEK2 and APC 04/20/2021   Family history of prostate cancer 04/20/2021    Family History  Problem Relation Age of Onset   Cancer Mother    Breast cancer Mother        DCIS; dx 5s   Other Father        mutations in CHEK2 and APC genes   Depression Maternal Grandmother    Cancer Maternal Grandmother    Congestive Heart Failure Maternal Grandmother 31   Breast cancer Maternal Grandmother        dx 80s   Colon cancer Maternal Grandmother 75   Hypertension Maternal Grandfather    Heart disease Maternal Grandfather    Hypertension Paternal Grandmother    Heart disease Paternal Grandmother    Cancer Paternal Grandfather    Prostate cancer Paternal Grandfather        dx 41s   Prostate cancer Other        mets? d. 75   Past Surgical History:  Procedure Laterality Date   WISDOM TOOTH EXTRACTION     Social History   Social History Narrative   She is originally from the [place] area and moved to [place] for her husband's job.   Lives at home with family.-Husband and 2 children ages 2 and 5.   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   Leftt-handed.   1 cup caffeine per day.   Immunization History  Administered Date(s) Administered   Influenza Inj Mdck Quad Pf 08/31/2018   Influenza, Seasonal, Injecte, Preservative Fre 09/12/2016   Influenza,inj,Quad PF,6+ Mos 09/12/2016, 09/02/2017   Influenza,inj,quad, With Preservative  08/31/2018   Influenza-Unspecified 09/12/2016, 09/02/2017, 08/31/2018   PFIZER(Purple Top)SARS-COV-2 Vaccination 03/23/2020, 04/14/2020   Tdap 11/25/2014, 12/01/2017     Objective: Vital Signs: BP 108/67 (BP Location: Right Arm, Patient Position: Sitting, Cuff Size: Normal)   Pulse 77   Resp 14   Ht 5\' 4"  (1.626 m)   Wt 137 lb (62.1 kg)   BMI 23.52 kg/m    Physical Exam HENT:     Mouth/Throat:     Mouth: Mucous membranes are moist.     Pharynx: Oropharynx is clear.  Eyes:     Conjunctiva/sclera: Conjunctivae normal.  Cardiovascular:     Rate and Rhythm: Normal rate and regular rhythm.  Pulmonary:     Effort: Pulmonary effort is normal.     Breath sounds: Normal breath sounds.  Musculoskeletal:     Right lower leg: No edema.     Left lower leg: No edema.  Lymphadenopathy:     Cervical: No cervical adenopathy.  Skin:    General: Skin is warm  and dry.     Findings: No rash.  Neurological:     Mental Status: She is alert.  Psychiatric:        Mood and Affect: Mood normal.      Musculoskeletal Exam:  Shoulders full ROM no tenderness or swelling Elbows full ROM no tenderness or swelling Wrists full ROM no tenderness or swelling Fingers full ROM no tenderness or swelling Knees full ROM no tenderness or swelling Ankles full ROM no tenderness or swelling MTPs full ROM no tenderness or swelling   Investigation: No additional findings.  Imaging: No results found.  Recent Labs: Lab Results  Component Value Date   WBC 5.8 05/14/2023   HGB 13.4 05/14/2023   PLT 237 05/14/2023   NA 139 05/14/2023   K 3.8 05/14/2023   CL 105 05/14/2023   CO2 22 05/14/2023   GLUCOSE 87 05/14/2023   BUN 11 05/14/2023   CREATININE 0.77 05/14/2023   BILITOT 0.4 05/14/2023   ALKPHOS 49 05/14/2023   AST 18 05/14/2023   ALT 16 05/14/2023   PROT 7.3 05/14/2023   ALBUMIN 4.7 05/14/2023   CALCIUM 9.2 05/14/2023   GFRAA 113 05/04/2020   QFTBGOLDPLUS NEGATIVE 06/27/2022     Speciality Comments: No specialty comments available.  Procedures:  No procedures performed Allergies: Patient has no known allergies.   Assessment / Plan:     Visit Diagnoses: Positive ANA (antinuclear antibody)  Persistent positive ANA test checked at multiple points time over the past few years including previous evaluation at [facility] rheumatology 2022.  Previously as high as 1:320 homogenous pattern most recently at 1:160.  No specific extractable nuclear antibodies identified on previous workup most recently with some from May.  Reports abnormal complement levels detected on testing with her functional medicine doctor.  I do not see any physical signs of active systemic connective tissue disease from review of labs imaging or on physical exam today.  Will send for AVISE connective tissue disease panel testing for more comprehensive workup written order provided.  Myalgic encephalomyelitis/chronic fatigue syndrome (ME/CFS)  Symptoms sound suggestive for chronic fatigue syndrome which would be similar or may be indistinguishable versus long COVID related symptoms.  Provided some information to review regarding this as well.  Orders: No orders of the defined types were placed in this encounter.  No orders of the defined types were placed in this encounter.   Follow-Up Instructions: Return in about 4 weeks (around 08/15/2023) for New pt +ANA/AVISE.   Fuller Plan, MD  Note - This record has been created using [employer].  Chart creation errors have been sought, but may not always  have been located. Such creation errors do not reflect on  the standard of medical care.

## 2023-07-21 DIAGNOSIS — Z9189 Other specified personal risk factors, not elsewhere classified: Secondary | ICD-10-CM | POA: Diagnosis not present

## 2023-07-21 DIAGNOSIS — Z1239 Encounter for other screening for malignant neoplasm of breast: Secondary | ICD-10-CM | POA: Diagnosis not present

## 2023-07-21 DIAGNOSIS — Z1501 Genetic susceptibility to malignant neoplasm of breast: Secondary | ICD-10-CM | POA: Diagnosis not present

## 2023-07-21 DIAGNOSIS — Z1231 Encounter for screening mammogram for malignant neoplasm of breast: Secondary | ICD-10-CM | POA: Diagnosis not present

## 2023-07-21 DIAGNOSIS — Z1502 Genetic susceptibility to malignant neoplasm of ovary: Secondary | ICD-10-CM | POA: Diagnosis not present

## 2023-07-21 DIAGNOSIS — R928 Other abnormal and inconclusive findings on diagnostic imaging of breast: Secondary | ICD-10-CM | POA: Diagnosis not present

## 2023-07-21 DIAGNOSIS — Z1509 Genetic susceptibility to other malignant neoplasm: Secondary | ICD-10-CM | POA: Diagnosis not present

## 2023-07-21 DIAGNOSIS — Z1589 Genetic susceptibility to other disease: Secondary | ICD-10-CM | POA: Diagnosis not present

## 2023-07-21 DIAGNOSIS — N6002 Solitary cyst of left breast: Secondary | ICD-10-CM | POA: Diagnosis not present

## 2023-07-21 DIAGNOSIS — Z803 Family history of malignant neoplasm of breast: Secondary | ICD-10-CM | POA: Diagnosis not present

## 2023-07-21 DIAGNOSIS — N6489 Other specified disorders of breast: Secondary | ICD-10-CM | POA: Diagnosis not present

## 2023-07-21 NOTE — Telephone Encounter (Signed)
Patient advised Dr. Dimple Casey recommends waiting and see if they were able to do the whole panel or if specific labs are missing we can send these to [facility]. Usually we have been getting results back in the next 2 weeks. Patient verbalized understanding.

## 2023-07-21 NOTE — Telephone Encounter (Signed)
Patient advised Sorry to hear about passing out with the blood draw. Does she know if the lab was able to send what they collected or could they not run the test? If they were running it Dr. Dimple Casey would like to wait for results before getting anything else drawn. If not we can send orders for [facility] it just won't be exactly the same as the AVISE panel.   Patient states she believes they got one tube before she had passed out. Patient states she was told they were going to send what they took. Patient states she has not heard anything else.

## 2023-07-21 NOTE — Telephone Encounter (Signed)
I recommend waiting and see if they were able to do the whole panel or if specific labs are missing we can send these to [facility]. Usually we have been getting results back in the next 2 weeks.

## 2023-07-21 NOTE — Telephone Encounter (Signed)
Sorry to hear about passing out with the blood draw. Does she know if the lab was able to send what they collected or could they not run the test? If they were running it I would like to wait for results before getting anything else drawn. If not we can send orders for [facility] it just won't be exactly the same as the AVISE panel.

## 2023-10-14 ENCOUNTER — Ambulatory Visit (INDEPENDENT_AMBULATORY_CARE_PROVIDER_SITE_OTHER): Payer: 59 | Admitting: Family Medicine

## 2023-10-14 ENCOUNTER — Telehealth: Payer: Self-pay | Admitting: *Deleted

## 2023-10-14 ENCOUNTER — Encounter: Payer: Self-pay | Admitting: Family Medicine

## 2023-10-14 VITALS — BP 117/75 | HR 73 | Temp 98.4°F | Resp 18 | Ht 63.0 in | Wt 134.4 lb

## 2023-10-14 DIAGNOSIS — Z0000 Encounter for general adult medical examination without abnormal findings: Secondary | ICD-10-CM | POA: Diagnosis not present

## 2023-10-14 LAB — COMPREHENSIVE METABOLIC PANEL
ALT: 17 U/L (ref 0–35)
AST: 21 U/L (ref 0–37)
Albumin: 4.9 g/dL (ref 3.5–5.2)
Alkaline Phosphatase: 54 U/L (ref 39–117)
BUN: 13 mg/dL (ref 6–23)
CO2: 27 meq/L (ref 19–32)
Calcium: 9.6 mg/dL (ref 8.4–10.5)
Chloride: 103 meq/L (ref 96–112)
Creatinine, Ser: 0.82 mg/dL (ref 0.40–1.20)
GFR: 89.89 mL/min (ref >60.00)
Glucose, Bld: 78 mg/dL (ref 70–99)
Potassium: 3.9 meq/L (ref 3.5–5.1)
Sodium: 137 meq/L (ref 135–145)
Total Bilirubin: 0.6 mg/dL (ref 0.2–1.2)
Total Protein: 7.7 g/dL (ref 6.0–8.3)

## 2023-10-14 LAB — IBC + FERRITIN
Ferritin: 64 ng/mL (ref 10.0–291.0)
Iron: 95 ug/dL (ref 42–145)
Saturation Ratios: 26.3 % (ref 20.0–50.0)
TIBC: 361.2 ug/dL (ref 250.0–450.0)
Transferrin: 258 mg/dL (ref 212.0–360.0)

## 2023-10-14 LAB — LIPID PANEL
Cholesterol: 196 mg/dL (ref 0–200)
HDL: 61.8 mg/dL (ref >39.00)
LDL Cholesterol: 122 mg/dL — ABNORMAL HIGH (ref 0–99)
NonHDL: 133.98
Total CHOL/HDL Ratio: 3
Triglycerides: 59 mg/dL (ref 0.0–149.0)
VLDL: 11.8 mg/dL (ref 0.0–40.0)

## 2023-10-14 LAB — CBC WITH DIFFERENTIAL/PLATELET
Basophils Absolute: 0 10*3/uL (ref 0.0–0.1)
Basophils Relative: 0.7 % (ref 0.0–3.0)
Eosinophils Absolute: 0.1 10*3/uL (ref 0.0–0.7)
Eosinophils Relative: 1.8 % (ref 0.0–5.0)
HCT: 43.4 % (ref 36.0–46.0)
Hemoglobin: 14.3 g/dL (ref 12.0–15.0)
Lymphocytes Relative: 24.7 % (ref 12.0–46.0)
Lymphs Abs: 1.3 10*3/uL (ref 0.7–4.0)
MCHC: 33.1 g/dL (ref 30.0–36.0)
MCV: 98.6 fL (ref 78.0–100.0)
Monocytes Absolute: 0.3 10*3/uL (ref 0.1–1.0)
Monocytes Relative: 5.4 % (ref 3.0–12.0)
Neutro Abs: 3.6 10*3/uL (ref 1.4–7.7)
Neutrophils Relative %: 67.4 % (ref 43.0–77.0)
Platelets: 244 10*3/uL (ref 150.0–400.0)
RBC: 4.4 Mil/uL (ref 3.87–5.11)
RDW: 12.6 % (ref 11.5–15.5)
WBC: 5.3 10*3/uL (ref 4.0–10.5)

## 2023-10-14 LAB — C-REACTIVE PROTEIN: CRP: <1 mg/dL (ref 0.5–20.0)

## 2023-10-14 LAB — HEMOGLOBIN A1C: Hgb A1c MFr Bld: 4.8 % (ref 4.6–6.5)

## 2023-10-14 LAB — TSH: TSH: 1.16 u[IU]/mL (ref 0.35–5.50)

## 2023-10-14 NOTE — Telephone Encounter (Signed)
Patient contacted the office checking on her AVISE labs. Patient states she has not heard anything. Patient states she had those done on 07/18/2023. Please advise.

## 2023-10-14 NOTE — Progress Notes (Signed)
Labs look great  Happy Holidays

## 2023-10-14 NOTE — Progress Notes (Signed)
Phone [phone]   Subjective:   Patient is a 37 y.o. female presenting for annual physical.    Chief Complaint  Patient presents with   Annual Exam    CPE Fasting   Annual - some exercise but "gets 10K steps per day"  Weight management - Stays active by walking, takes around 10K steps a day. Is taking Mounjaro 2.5 mg weekly. Tolerating well, no side effects reported. Has lost weight while on Mounjaro. Jeanette Rice is currently trying to lose much more weight, and is more interested in maintaining her weight.   "Heart-racing" - Jeanette Rice reports episodes of "heart-racing", especially in the mornings. Jeanette Rice monitors her HR and states readings range from 95-120 bpm in he mornings. Jeanette Rice has been seen by cardiology for this before and has had a cardiopulmonary stress test. It was recommended Jeanette Rice get a heart monitor, but at the time it was summer and Jeanette Rice was going to the pool very often.   Constipation - Complains of chronic constipation. Jeanette Rice has started taking Magnesium to help. Jeanette Rice endorses an episode of vomiting recently, which is unusual for her. Pt unsure of the cause.   Headaches - Jeanette Rice reports headaches about 1-2 times a week. When needed Jeanette Rice takes a Tylenol and also has Excedrin Migraine if needed. Overall Jeanette Rice is managing her headaches well.   Reports Jeanette Rice is taking lo loestrin daily.   Is taking vitamin D OTC, not daily.   See problem oriented charting- ROS- ROS: Gen: no fever, chills  Skin: no rash, itching ENT: no ear pain, ear drainage, nasal congestion, rhinorrhea, sinus pressure, sore throat Eyes: no blurry vision, double vision Resp: no cough, wheeze,SOB CV: no CP, palpitations, LE edema,  GI: no heartburn, n/v/d/c, abd pain GU: no dysuria, urgency, frequency, hematuria MSK: no joint pain, myalgias, back pain Neuro: no dizziness, headache, weakness, vertigo Psych: no depression, anxiety, insomnia, SI   The following were reviewed and entered/updated in epic: Past Medical History:   Diagnosis Date   Allergy    COVID-19 long hauler manifesting chronic fatigue 08/20/2021   COVID-19 virus infection    July 2020   Family history of breast cancer 04/20/2021   Family history of colon cancer 04/20/2021   Family history of gene mutation in CHEK2 and APC 04/20/2021   Family history of prostate cancer 04/20/2021   Patient Active Problem List   Diagnosis Date Noted   Positive ANA (antinuclear antibody) 07/18/2023   Chronic low back pain 03/31/2023   COVID-19 long hauler manifesting chronic fatigue 08/20/2021   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   Genetic testing 05/14/2021   Family history of breast cancer 04/20/2021   Family history of prostate cancer 04/20/2021   Family history of colon cancer 04/20/2021   Family history of gene mutation in CHEK2 and APC 04/20/2021   Premature atrial contractions 07/20/2020   Shortness of breath 07/20/2020   Arrhythmia, atrial 07/20/2020   History of COVID-19 05/03/2020   Fatigue 05/03/2020   Muscle pain 05/03/2020   Lower extremity pain, bilateral 05/03/2020   Physical deconditioning 05/03/2020   Paresthesia 12/14/2019   Normal labor 01/21/2018   Postpartum care following vaginal delivery 01/21/2018   Past Surgical History:  Procedure Laterality Date   WISDOM TOOTH EXTRACTION      Family History  Problem Relation Age of Onset   Cancer Mother    Breast cancer Mother        DCIS; dx 74s   Other Father  mutations in CHEK2 and APC genes   Depression Maternal Grandmother    Cancer Maternal Grandmother    Congestive Heart Failure Maternal Grandmother 59   Breast cancer Maternal Grandmother        dx 80s   Colon cancer Maternal Grandmother 75   Hypertension Maternal Grandfather    Heart disease Maternal Grandfather    Hypertension Paternal Grandmother    Heart disease Paternal Grandmother    Cancer Paternal Grandfather    Prostate cancer Paternal Grandfather        dx 14s   Prostate  cancer Other        mets? d. 86    Medications- reviewed and updated Current Outpatient Medications  Medication Sig Dispense Refill   ivermectin (STROMECTOL) 3 MG TABS tablet Take by mouth as needed.     LO LOESTRIN FE 1 MG-10 MCG / 10 MCG tablet Take 1 tablet by mouth daily.     MAGNESIUM PO Take by mouth.     tirzepatide (MOUNJARO) 5 MG/0.5ML Pen Inject 2.5 mg into the skin once a week.     clonazePAM (KLONOPIN) 0.5 MG tablet Take 1 tablet (0.5 mg total) by mouth daily as needed for anxiety. (Patient not taking: Reported on 10/14/2023) 10 tablet 0   No current facility-administered medications for this visit.    Allergies-reviewed and updated No Known Allergies  Social History   Social History Narrative   Jeanette Rice is originally from the [place] area and moved to [place] for her husband's job.   Lives at home with family.-Husband and 2 children ages 2 and 5.   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   Leftt-handed.   1 cup caffeine per day.   Objective  Objective:  BP 117/75   Pulse 73   Temp 98.4 F (36.9 C) (Temporal)   Resp 18   Ht 5\' 3"  (1.6 m)   Wt 134 lb 6 oz (61 kg)   SpO2 100%   BMI 23.80 kg/m  Physical Exam  Gen: WDWN NAD HEENT: NCAT, conjunctiva not injected, sclera nonicteric TM WNL B, OP moist, no exudates  NECK:  supple, no thyromegaly, no nodes, no carotid bruits CARDIAC: RRR, S1S2+, no murmur. DP 2+B LUNGS: CTAB. No wheezes ABDOMEN:  BS+, soft, NTND, No HSM, no masses EXT:  no edema MSK: no gross abnormalities. MS 5/5 all 4 NEURO: A&O x3.  CN II-XII intact.  PSYCH: normal mood. Good eye contact      Assessment and Plan   Health Maintenance counseling: 1. Anticipatory guidance: Patient counseled regarding regular dental exams q6 months, eye exams,  avoiding smoking and second hand smoke, limiting alcohol to 1 beverage per day, no illicit drugs.   2. Risk factor reduction:  Advised patient of need for regular exercise and diet rich and fruits and vegetables  to reduce risk of heart attack and stroke. Exercise- encouraged.  Wt Readings from Last 3 Encounters:  10/14/23 134 lb 6 oz (61 kg)  07/18/23 137 lb (62.1 kg)  05/20/23 140 lb 9.6 oz (63.8 kg)   3. Immunizations/screenings/ancillary studies Immunization History  Administered Date(s) Administered   Influenza Inj Mdck Quad Pf 08/31/2018   Influenza, Seasonal, Injecte, Preservative Fre 09/12/2016   Influenza,inj,Quad PF,6+ Mos 09/12/2016, 09/02/2017   Influenza,inj,quad, With Preservative 08/31/2018   Influenza-Unspecified 09/12/2016, 09/02/2017, 08/31/2018   Janssen (J&J) SARS-COV-2 Vaccination 03/04/2020   PFIZER(Purple Top)SARS-COV-2 Vaccination 03/23/2020, 04/14/2020   Tdap 11/25/2014, 12/01/2017   There are no preventive care reminders to display for this  patient.   4. Cervical cancer screening: Per pt, pap is UTD. States Jeanette Rice follows up with gyn at least annually. Next visit with gyn is in December.  5. Skin cancer screening- advised regular sunscreen use. Denies worrisome, changing, or new skin lesions.  6. Birth control/STD check: Taking OCPs 7. Smoking associated screening: non smoker 8. Alcohol screening: n/a  Wellness examination -     CBC with Differential/Platelet -     Comprehensive metabolic panel -     Hemoglobin A1c -     IBC + Ferritin -     TSH -     Lipid panel -     C-reactive protein  Wellness-anticipatory guidance.  Work on Diet/Exercise  Check CBC,CMP,lipids,TSH, A1C.  F/u 1 yr   Recommended follow up: Return in about 1 year (around 10/13/2024) for annual physical.  Lab/Order associations: +fasting    I, Isabelle Course, acting as a scribe for Angelena Sole, MD., have documented all relevant documentation on the behalf of Angelena Sole, MD, as directed by  Angelena Sole, MD while in the presence of Angelena Sole, MD.  I, Angelena Sole, MD, have reviewed all documentation for this visit. The documentation on 10/14/23 for the exam, diagnosis, procedures, and orders  are all accurate and complete.  Angelena Sole, MD

## 2023-10-14 NOTE — Patient Instructions (Addendum)
It was very nice to see you today!  [facility] [phone]     PLEASE NOTE:  If you had any lab tests please let us know if you have not heard back within a few days. You may see your results on MyChart before we have a chance to review them but we will give you a call once they are reviewed by [place]. If we ordered any referrals today, please let us know if you have not heard from their office within the next week.   Please try these tips to maintain a healthy lifestyle:  Eat most of your calories during the day when you are active. Eliminate processed foods including packaged sweets (pies, cakes, cookies), reduce intake of potatoes, white bread, white pasta, and white rice. Look for whole grain options, oat flour or almond flour.  Each meal should contain half fruits/vegetables, one quarter protein, and one quarter carbs (no bigger than a computer mouse).  Cut down on sweet beverages. This includes juice, soda, and sweet tea. Also watch fruit intake, though this is a healthier sweet option, it still contains natural sugar! Limit to 3 servings daily.  Drink at least 1 glass of water with each meal and aim for at least 8 glasses per day  Exercise at least 150 minutes every week.

## 2023-10-26 ENCOUNTER — Encounter: Payer: Self-pay | Admitting: Family Medicine

## 2023-10-27 ENCOUNTER — Encounter: Payer: Self-pay | Admitting: Family Medicine

## 2023-10-27 ENCOUNTER — Telehealth (INDEPENDENT_AMBULATORY_CARE_PROVIDER_SITE_OTHER): Payer: 59 | Admitting: Family Medicine

## 2023-10-27 DIAGNOSIS — M545 Low back pain, unspecified: Secondary | ICD-10-CM | POA: Diagnosis not present

## 2023-10-27 MED ORDER — CYCLOBENZAPRINE HCL 5 MG PO TABS: 5.0000 mg | ORAL_TABLET | Freq: Three times a day (TID) | ORAL | 0 refills | Status: DC | PRN

## 2023-10-27 NOTE — Progress Notes (Signed)
MyChart Video Visit Virtual Visit via Video Note   This visit type was conducted w/patient consent. This format is felt to be most appropriate for this patient at this time. Physical exam was limited by quality of the video and audio technology used for the visit. CMA was able to get the patient set up on a video visit.  Patient location: Home. Patient and provider in visit Provider location: Office  I discussed the limitations of evaluation and management by telemedicine and the availability of in person appointments. The patient expressed understanding and agreed to proceed.  Visit Date: 10/27/2023  Today's healthcare provider: Angelena Sole, MD     Subjective:    Patient ID: Jeanette Rice, female    DOB: [birth year], 37 y.o.   MRN: [id number]  Chief Complaint  Patient presents with   Back Pain    Lower back pain, radiates to both sides Took 3 Advil this morning, took Flexeril from 2017 on Saturday and Sunday    HPI Mild chronic LBP occ.  This time, did some gardening on 11/24.  Next day LBP and getting worse.  More on L. No rad down leg.  No rash.  No weakness.  Hurts to bend to left, getting up from sitting. No b/b dysfunction.  No dysuria. No hematuria.  No f/c.  Taking advil.   Took 5mg  of husb flexeril from 2017.  Achey all over in general.   Will see chiro tomorrow.  Did massage yesterday.  Sharp pain.     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     LO LOESTRIN FE 1 MG-10 MCG / 10 MCG tablet Take 1 tablet by mouth daily.     MAGNESIUM PO Take by mouth.     tirzepatide (MOUNJARO) 5 MG/0.5ML Pen Inject 2.5 mg into the skin once a week.     clonazePAM (KLONOPIN) 0.5 MG tablet Take 1 tablet (0.5 mg total) by mouth daily as needed for anxiety. (Patient not taking: Reported on 10/27/2023) 10 tablet 0   No facility-administered medications prior to visit.    No Known Allergies      Objective:     Physical Exam  Vitals and nursing note reviewed.  Constitutional:      General:  is not in acute distress.    Appearance: Normal appearance.  HENT:     Head: Normocephalic.  Pulmonary:     Effort: No respiratory distress.  Skin:    General: Skin is dry.     Coloration: Skin is not pale.  Neurological:     Mental Status: Pt is alert and oriented to person, place, and time.  Psychiatric:        Mood and Affect: Mood normal.   Walking around house ok.  Pain to flex 10 degrees.  Pain to extend and pain to rotate L.  Can stand on 1 leg and toes.  There  were no vitals taken for this visit.  Wt Readings from Last 3 Encounters:  10/14/23 134 lb 6 oz (61 kg)  07/18/23 137 lb (62.1 kg)  05/20/23 140 lb 9.6 oz (63.8 kg)       Assessment & Plan:   Problem List Items Addressed This Visit   None Visit Diagnoses     Acute left-sided low back pain without sciatica    -  Primary   Relevant Medications   cyclobenzaprine (FLEXERIL) 5 MG tablet      Acute l sided LBP w/o sciatica.  Ibuprofen 600mg  q 6h, tylenol, heat,ice,stretches.  Flexeril 5mg  q8h prn.  Has chiro tomorrow.  Has appt sch w/ortho o 12/11   worse, new symptoms, let us know  Meds ordered this encounter  Medications   cyclobenzaprine (FLEXERIL) 5 MG tablet    Sig: Take 1 tablet (5 mg total) by mouth 3 (three) times daily as needed for muscle spasms.    Dispense:  15 tablet    Refill:  0    I discussed the assessment and treatment plan with the patient. The  patient was provided an opportunity to ask questions and all were answered. The patient agreed with the plan and demonstrated an understanding of the instructions.   The patient was advised to call back or seek an in-person evaluation if the symptoms worsen or if the condition fails to improve as anticipated.  Return if symptoms worsen or fail to improve.  Angelena Sole, MD [facility] at [facility] [phone] (phone) [phone] (fax)  [facility]

## 2023-10-27 NOTE — Patient Instructions (Signed)
Take advil 600mg  every 6-8 hrs.  Tylenol.  Heat/ice.  Sending in flexeril

## 2023-10-28 ENCOUNTER — Telehealth: Payer: 59 | Admitting: Family Medicine

## 2023-10-28 DIAGNOSIS — M5386 Other specified dorsopathies, lumbar region: Secondary | ICD-10-CM | POA: Diagnosis not present

## 2023-10-28 DIAGNOSIS — M9904 Segmental and somatic dysfunction of sacral region: Secondary | ICD-10-CM | POA: Diagnosis not present

## 2023-10-28 DIAGNOSIS — M9903 Segmental and somatic dysfunction of lumbar region: Secondary | ICD-10-CM | POA: Diagnosis not present

## 2023-10-28 DIAGNOSIS — Z1389 Encounter for screening for other disorder: Secondary | ICD-10-CM | POA: Diagnosis not present

## 2023-10-28 DIAGNOSIS — Z130 Encounter for screening for diseases of the blood and blood-forming organs and certain disorders involving the immune mechanism: Secondary | ICD-10-CM | POA: Diagnosis not present

## 2023-10-28 DIAGNOSIS — Z3040 Encounter for surveillance of contraceptives, unspecified: Secondary | ICD-10-CM | POA: Diagnosis not present

## 2023-10-28 DIAGNOSIS — M9902 Segmental and somatic dysfunction of thoracic region: Secondary | ICD-10-CM | POA: Diagnosis not present

## 2023-10-28 DIAGNOSIS — Z01419 Encounter for gynecological examination (general) (routine) without abnormal findings: Secondary | ICD-10-CM | POA: Diagnosis not present

## 2023-10-28 DIAGNOSIS — Z8041 Family history of malignant neoplasm of ovary: Secondary | ICD-10-CM | POA: Diagnosis not present

## 2023-10-28 DIAGNOSIS — Q999 Chromosomal abnormality, unspecified: Secondary | ICD-10-CM | POA: Diagnosis not present

## 2023-10-28 DIAGNOSIS — M62830 Muscle spasm of back: Secondary | ICD-10-CM | POA: Diagnosis not present

## 2023-10-29 DIAGNOSIS — M62830 Muscle spasm of back: Secondary | ICD-10-CM | POA: Diagnosis not present

## 2023-10-29 DIAGNOSIS — M9904 Segmental and somatic dysfunction of sacral region: Secondary | ICD-10-CM | POA: Diagnosis not present

## 2023-10-29 DIAGNOSIS — M9902 Segmental and somatic dysfunction of thoracic region: Secondary | ICD-10-CM | POA: Diagnosis not present

## 2023-10-29 DIAGNOSIS — M5386 Other specified dorsopathies, lumbar region: Secondary | ICD-10-CM | POA: Diagnosis not present

## 2023-10-29 DIAGNOSIS — M9903 Segmental and somatic dysfunction of lumbar region: Secondary | ICD-10-CM | POA: Diagnosis not present

## 2023-11-03 ENCOUNTER — Ambulatory Visit: Payer: 59 | Admitting: Cardiology

## 2023-11-05 ENCOUNTER — Ambulatory Visit: Payer: 59 | Admitting: Orthopedic Surgery

## 2023-11-12 ENCOUNTER — Ambulatory Visit: Payer: 59 | Attending: Cardiology

## 2023-11-12 ENCOUNTER — Telehealth: Payer: Self-pay | Admitting: Cardiology

## 2023-11-12 DIAGNOSIS — R002 Palpitations: Secondary | ICD-10-CM

## 2023-11-12 DIAGNOSIS — I498 Other specified cardiac arrhythmias: Secondary | ICD-10-CM

## 2023-11-12 NOTE — Telephone Encounter (Signed)
Spoke to DOD Dr. Bjorn Pippin  Dr. Bjorn Pippin states:   -7 day Zio monitor   -1 month follow up  ______________________________________ Patient identification verified by 2 forms. Marilynn Rail, RN    Called and spoke to patient  Relayed DOD recommendations  Advised patient:   -start monitor on  11/27/23 to be able to mail it out on 12/04/23 Patient scheduled for 12/26/23 Reviewed strict ED warning signs/precautions  Patient verbalized understanding, no questions at this time

## 2023-11-12 NOTE — Progress Notes (Unsigned)
Enrolled patient for a 7 day Zio XT monitor to be mailed to patients home.  

## 2023-11-12 NOTE — Telephone Encounter (Addendum)
Patient identification verified by 2 forms. Marilynn Rail, RN    Called and spoke to patient  Patient states:   -was not feeling great last night   -completed EKG on watch, showed afib, HR 165   -HR remained that high for 1 hour, occurred while she was sitting   -HR this morning is 100-110   -most morning her HR is 100-130  -in the summer her HR started ranging from 40-140  -this morning EKG showed high heart rate but no afib   -does not want to go to ER due to high copay   -wants to know what to do if heart rate gets that high   -is concerned about traveling in a few days  Patient denies:   -SOB/difficulty breathing   -heart palpitation   -chest pain  Advised patient to present to ED if becomes symptomatic like last night, has high HR and afib alarm  Informed patient RN will speak to DOD and return call with recommendations  Patient agrees with plan, no questions at this time

## 2023-11-12 NOTE — Telephone Encounter (Signed)
STAT if HR is under 50 or over 120 (normal HR is 60-100 beats per minute)  What is your heart rate?   165, 10:00 pm last night  Do you have a log of your heart rate readings (document readings)? No   Do you have any other symptoms?  No  Patient stated something felt off last night and her heart monitor show her reading were high.

## 2023-11-15 DIAGNOSIS — L639 Alopecia areata, unspecified: Secondary | ICD-10-CM | POA: Diagnosis not present

## 2023-11-27 DIAGNOSIS — I498 Other specified cardiac arrhythmias: Secondary | ICD-10-CM | POA: Diagnosis not present

## 2023-11-27 DIAGNOSIS — R002 Palpitations: Secondary | ICD-10-CM | POA: Diagnosis not present

## 2023-12-08 DIAGNOSIS — R002 Palpitations: Secondary | ICD-10-CM | POA: Diagnosis not present

## 2023-12-15 ENCOUNTER — Encounter: Payer: Self-pay | Admitting: *Deleted

## 2023-12-22 ENCOUNTER — Ambulatory Visit: Payer: 59 | Attending: Cardiology | Admitting: Cardiology

## 2023-12-22 VITALS — BP 110/66 | HR 94 | Ht 63.0 in | Wt 138.0 lb

## 2023-12-22 DIAGNOSIS — I491 Atrial premature depolarization: Secondary | ICD-10-CM

## 2023-12-22 DIAGNOSIS — I498 Other specified cardiac arrhythmias: Secondary | ICD-10-CM | POA: Diagnosis not present

## 2023-12-22 DIAGNOSIS — R5381 Other malaise: Secondary | ICD-10-CM

## 2023-12-22 MED ORDER — METOPROLOL TARTRATE 25 MG PO TABS: ORAL_TABLET | ORAL | 3 refills | Status: AC

## 2023-12-22 NOTE — Patient Instructions (Addendum)
Other Instructions    Keep hydrated - you may use   electrolyte supplements  Medication Instructions:    Wean off Mounjaro  for 2 weeks  take 3 doses, then for 3 weeks 2 doses then stop  Metoprolol tartrate 25 mg ----May take 25 mg  tablet 2 to 3 times a day  up to 8 hours apart if needed for palp     *If you need a refill on your cardiac medications before your next appointment, please call your pharmacy*   Lab Work: Not needed    Testing/Procedures: Not needed   Follow-Up: At [facility] you and your health needs are our priority.  As part of our continuing mission to provide you with exceptional heart care, we have created designated Provider Care Teams.  These Care Teams include your primary Cardiologist (physician) and Advanced Practice Providers (APPs -  Physician Assistants and Nurse Practitioners) who all work together to provide you with the care you need, when you need it.     Your next appointment:   6 month(s)  The format for your next appointment:   In Person  Provider:   Juanda Crumble, PA-C, Joni Reining, DNP, ANP, Bernadene Person, NP, or Reather Littler, NP       Other Instructions    Keep hydrated - you may use   electrolyte supplements

## 2023-12-22 NOTE — Progress Notes (Unsigned)
Cardiology Office Note:  .   Date:  12/24/2023  ID:  ROY SNUFFER, DOB [birth year], MRN [id number] PCP: Jeani Sow, MD  [facility] Providers Cardiologist:  Bryan Lemma, MD     Chief Complaint  Patient presents with   Follow-up    6-86-month follow-up.  Concerned about possible A-fib   Palpitations     Monitor reviewed. Discussed palpitations and Apple Watch monitor    Patient Profile: .     Jeanette Rice is a very anxious 38 y.o. female with a PMH notable for symptomatic palpitations and anxiety who presents here for 6-70-month follow-up at the request of Jeani Sow, MD.   Jeanette Rice was last seen on June  25,2024 by Carlos Levering, NP as a delayed follow-up.  I had last seen her in October 2023.  She has no symptoms of rapid palpitations setting of URI.  She reported labile heart rates between 7 and 9 AM with heart rates can go from 4240 bpm while simply standing in the kitchen.  Occur randomly and can get as high as 160 bpm.  Does not necessarily notice any significant anxiety beyond her normal day-to-day stress.  Apple watch EKG strips were all sinus rhythm.  Noted some chest tightness with the tachycardia, but not without.  Otherwise walks on short walks for total of 10,000 On Most Days. She started tirzepatide in April and wonders if this has caused her heart rate to increase. She was quite concerned about afib, as her dad was recently diagnosed with Afib and underwent ablation.  Felt that tachycardia spells or not lasting long enough to warrant beta-blocker. She wanted to hold off until the summertime to consider Zio patch. CPX ordered  Subjective  Discussed the use of AI scribe software for clinical note transcription with the patient, who gave verbal consent to proceed.  History of Present Illness   The patient, with a history of cardiac arrhythmias, presented with concerns about episodes of atrial fibrillation (AFib) and tachycardia. The patient  reported that these episodes began in December, with their heart rate increasing significantly and their heart rhythm monitor indicating AFib. The patient described these episodes as their heart "going crazy," with rates reaching up to 160 beats per minute. The patient noted that standing seemed to alleviate the symptoms somewhat.  The patient also reported experiencing two significant episodes of AFib, one on December 17th and another on December 21st. The patient has been monitoring their heart rhythm closely since these episodes and has noticed an increase in premature ventricular contractions (PVCs) and premature atrial contractions (PACs).  The patient has been on a weight loss medication, which they suspect may be contributing to their cardiac symptoms. They noted that their heart rate tends to increase on the day they take the medication, and they have observed more PVCs on these days. The patient also reported feeling fatigued and having difficulty with activities such as climbing stairs, particularly in the mornings.  The patient has been using a heart rhythm monitor to track their heart rate and rhythm. They reported that during a week of monitoring, they felt better than they had in a long time, with fewer cardiac symptoms. However, the monitor did detect several short bursts of rapid heartbeats, with up to 15 beats in a row.  The patient has a history of chronic fatigue and has been maintaining a consistent weight for the past four months. They expressed concern about potential weight gain if they discontinue  the weight loss medication. The patient also reported a family history of AFib, with their father recently undergoing an ablation procedure.      Cardiovascular ROS: no chest pain or dyspnea on exertion positive for - irregular heartbeat, palpitations, rapid heart rate, and mild chronic fatigue.  Stabilized weight negative for - edema, orthopnea, paroxysmal nocturnal dyspnea, shortness  of breath, or syncope or near syncope TIA or emesis fugax, claudication    Objective   Studies Reviewed: Marland Kitchen       CPX 06/12/23: Exercise testing with gas exchange demonstrates low-normal functional capacity when compared to matched sedentary norms.  Attending: Low normal exercise capacity. No obvious cardiopulmonary abnormality. Suspect deconditioning as primary cause. Would suggest formal exercise program and re-test in 4-6 months if symptoms persist.   7-Day Zio 10/2023-11/2023: Frequent PACs (6.2%), 15 short Atrial Runs (9--15 beats)  ECHO 07/2020: Normal LV  (EF 60-65%) and RV function.  Mild Bi-Atrial Enlargement No valvular normalities.  Lab Results  Component Value Date   CHOL 196 10/14/2023   HDL 61.80 10/14/2023   LDLCALC 122 (H) 10/14/2023   TRIG 59.0 10/14/2023   CHOLHDL 3 10/14/2023   Lab Results  Component Value Date   NA 137 10/14/2023   K 3.9 10/14/2023   CREATININE 0.82 10/14/2023   GFR 89.89 10/14/2023   GLUCOSE 78 10/14/2023   Lab Results  Component Value Date   HGBA1C 4.8 10/14/2023    Risk Assessment/Calculations:        Physical Exam:   VS:  BP 110/66 (BP Location: Left Arm, Patient Position: Sitting, Cuff Size: Normal)   Pulse 94   Ht 5\' 3"  (1.6 m)   Wt 138 lb (62.6 kg)   SpO2 98%   BMI 24.45 kg/m    Wt Readings from Last 3 Encounters:  12/22/23 138 lb (62.6 kg)  10/14/23 134 lb 6 oz (61 kg)  07/18/23 137 lb (62.1 kg)    GEN: Well nourished, well groomed in no acute distress; very anxious but otherwise healthy-appearing NECK: No JVD; No carotid bruits CARDIAC: Normal S1, S2; RRR, no murmurs, rubs, gallops RESPIRATORY:  Clear to auscultation without rales, wheezing or rhonchi ; nonlabored, good air movement. ABDOMEN: Soft, non-tender, non-distended EXTREMITIES:  No edema; No deformity     ASSESSMENT AND PLAN: .    Problem List Items Addressed This Visit       Cardiology Problems   Arrhythmia, atrial (Chronic)   See findings for  PACs.  Review of her telemetry monitor shows mostly sinus rhythm with short little atrial runs as well as PACs and bigeminy, trigeminy.  Plan is to maintain adequate hydration, wean off Mounjaro and continue healthy lifestyle. Will initiate PRN beta-blocker.      Relevant Medications   metoprolol tartrate (LOPRESSOR) 25 MG tablet   Premature atrial contractions - Primary (Chronic)   Frequent PACs (6.2%) with episodes of atrial bigeminy and trigeminy. No true atrial fibrillation noted. Episodes of tachycardia with heart rate up to 160 bpm. Symptoms of fatigue and palpitations, potentially related to PACs and tachycardia. Possible correlation with weight loss medication (Mounjaro). -Wean off Mounjaro over the next several weeks (every 2 weeks for 3 doses, then every 3 weeks for 3 doses). -Stay adequately hydrated. -Recommend weaning off Mounjaro since this seems to be the main trigger-likely relating to electrolyte abnormalities and dehydration.  (She does not need it for weight loss) -Will start as needed metoprolol 25 mg up to every 8 hours as needed symptomatic tachycardia or  palpitations -If she has fatigue with metoprolol, can consider diltiazem as a second option  -Follow-up in 6 months.      Relevant Medications   metoprolol tartrate (LOPRESSOR) 25 MG tablet     Other   Physical deconditioning (Chronic)   She had weight loss with Mounjaro but now send cardiac effects.   Relatively reassuring CPX results back in July.  No obvious cardiopulmonary abnormalities.  Suspected deconditioning.  Recommendation was increased physical activity/exercise. Recommendation is to discontinue/wean off Mounjaro -Maintain hydration. -Continue healthy lifestyle habits to maintain weight loss after discontinuation of Mounjaro.         Follow-Up: Return in about 6 months (around 06/20/2024).  I spent 48 minutes in the care of Jeanette Rice today including reviewing labs (1 minute), reviewing  studies (5 minutes reviewing previous echocardiogram and cardiac monitor results prior to visit), face to face time discussing treatment options (32 minutes with the patient.  We reviewed the different studies, her EKGs, her previous monitor results and echocardiograms.  We then reviewed several episodes on her Apple watch monitor.  She was very concerned about A-fib.  Multiple strips were reviewed that all the appeared to be sinus rhythm with PACs and not A-fib.  We then talked about various options for treating including weaning off of the [facility] and starting beta-blocker.), reviewing records from previous notes (3 minutes), 7 minutes dictating, and documenting in the encounter.     Signed, Marykay Lex, MD, MS Bryan Lemma, M.D., M.S. Interventional Cardiologist  [facility]  Pager # [phone] Phone # [phone] [address] [place], [place] [address]

## 2023-12-24 ENCOUNTER — Encounter: Payer: Self-pay | Admitting: Cardiology

## 2023-12-24 NOTE — Assessment & Plan Note (Signed)
See findings for PACs.  Review of her telemetry monitor shows mostly sinus rhythm with short little atrial runs as well as PACs and bigeminy, trigeminy.  Plan is to maintain adequate hydration, wean off Mounjaro and continue healthy lifestyle. Will initiate PRN beta-blocker.

## 2023-12-24 NOTE — Assessment & Plan Note (Addendum)
She had weight loss with Mounjaro but now send cardiac effects.   Relatively reassuring CPX results back in July.  No obvious cardiopulmonary abnormalities.  Suspected deconditioning.  Recommendation was increased physical activity/exercise. Recommendation is to discontinue/wean off Mounjaro -Maintain hydration. -Continue healthy lifestyle habits to maintain weight loss after discontinuation of Mounjaro.

## 2023-12-24 NOTE — Assessment & Plan Note (Signed)
Frequent PACs (6.2%) with episodes of atrial bigeminy and trigeminy. No true atrial fibrillation noted. Episodes of tachycardia with heart rate up to 160 bpm. Symptoms of fatigue and palpitations, potentially related to PACs and tachycardia. Possible correlation with weight loss medication (Mounjaro). -Wean off Mounjaro over the next several weeks (every 2 weeks for 3 doses, then every 3 weeks for 3 doses). -Stay adequately hydrated. -Recommend weaning off Mounjaro since this seems to be the main trigger-likely relating to electrolyte abnormalities and dehydration.  (She does not need it for weight loss) -Will start as needed metoprolol 25 mg up to every 8 hours as needed symptomatic tachycardia or palpitations -If she has fatigue with metoprolol, can consider diltiazem as a second option  -Follow-up in 6 months.

## 2023-12-26 ENCOUNTER — Ambulatory Visit: Payer: 59 | Admitting: Cardiology

## 2023-12-29 ENCOUNTER — Other Ambulatory Visit: Payer: Self-pay | Admitting: Family Medicine

## 2023-12-29 ENCOUNTER — Encounter: Payer: Self-pay | Admitting: Family Medicine

## 2023-12-29 MED ORDER — OSELTAMIVIR PHOSPHATE 75 MG PO CAPS: 75.0000 mg | ORAL_CAPSULE | Freq: Every day | ORAL | 0 refills | Status: DC

## 2023-12-29 NOTE — Telephone Encounter (Unsigned)
Copied from CRM [id number]. Topic: General - Other >> Dec 29, 2023  4:53 PM Eunice Blase wrote: Reason for CRM: Pt called son is positive for flu and would like something sent to pharmacy for flu. Please call pt. [phone].

## 2024-01-19 ENCOUNTER — Encounter: Payer: Self-pay | Admitting: Cardiology

## 2024-01-19 NOTE — Telephone Encounter (Signed)
 Patient called regarding pt message to confirm it was received. Advised it was and she will receive a callback. Patient verbalized understanding.   HR at time of call was 82, symptoms better after taking metoprolol besides feeling a little cold which occurs when she takes the medication.  Please advise.

## 2024-02-25 DIAGNOSIS — L814 Other melanin hyperpigmentation: Secondary | ICD-10-CM | POA: Diagnosis not present

## 2024-02-25 DIAGNOSIS — D2222 Melanocytic nevi of left ear and external auricular canal: Secondary | ICD-10-CM | POA: Diagnosis not present

## 2024-02-25 DIAGNOSIS — D2272 Melanocytic nevi of left lower limb, including hip: Secondary | ICD-10-CM | POA: Diagnosis not present

## 2024-02-25 DIAGNOSIS — L821 Other seborrheic keratosis: Secondary | ICD-10-CM | POA: Diagnosis not present

## 2024-02-25 DIAGNOSIS — D2261 Melanocytic nevi of right upper limb, including shoulder: Secondary | ICD-10-CM | POA: Diagnosis not present

## 2024-02-25 DIAGNOSIS — D225 Melanocytic nevi of trunk: Secondary | ICD-10-CM | POA: Diagnosis not present

## 2024-02-25 DIAGNOSIS — D2262 Melanocytic nevi of left upper limb, including shoulder: Secondary | ICD-10-CM | POA: Diagnosis not present

## 2024-02-25 DIAGNOSIS — D2271 Melanocytic nevi of right lower limb, including hip: Secondary | ICD-10-CM | POA: Diagnosis not present

## 2024-04-28 ENCOUNTER — Encounter: Payer: Self-pay | Admitting: Cardiology

## 2024-05-14 ENCOUNTER — Encounter: Payer: Self-pay | Admitting: Pediatrics

## 2024-07-14 NOTE — Progress Notes (Signed)
 [facility] Initial Consultation   Referring Provider Wendolyn Jenkins Jansky, MD [address] [place],  [place] [address]  Primary Care Provider Wendolyn Jenkins Jansky, MD  Patient Profile: Jeanette Rice is a 38 y.o. female who is seen in consultation in the [facility] at the request of Dr. Wendolyn for evaluation and management of the problem(s) noted below.  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 Otherwise negative.  General Review of Systems  Review of systems is significant for the pertinent positives and negatives as listed per the HPI.  Full ROS is otherwise negative.  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   cyclobenzaprine  (FLEXERIL ) 5 MG tablet Take 1 tablet (5 mg total) by mouth 3 (three) times daily as needed for muscle spasms.   ivermectin (STROMECTOL) 3 MG TABS tablet Take by mouth as needed.   LO LOESTRIN FE 1 MG-10 MCG / 10 MCG tablet Take 1 tablet by mouth daily.   Na Sulfate-K Sulfate-Mg Sulfate concentrate (SUPREP) 17.5-3.13-1.6 GM/177ML SOLN Take 1 kit (354 mLs total) by mouth once for 1 dose.   tirzepatide (MOUNJARO) 5 MG/0.5ML Pen Inject 2.5 mg into the skin once a week. (Patient taking differently: Inject 2.5 mg into the skin once a week. Pt take between 2-3 weeks)  Family History   Family History  Problem Relation Age of Onset   Cancer Mother    Breast cancer Mother        DCIS; dx 64s   Other Father        mutations in CHEK2 and APC genes   Depression Maternal Grandmother    Cancer Maternal Grandmother    Congestive Heart Failure Maternal Grandmother 34   Breast  cancer Maternal Grandmother        dx 30s   Colon cancer Maternal Grandmother 75   Hypertension Maternal Grandfather    Heart disease Maternal Grandfather    Hypertension Paternal Grandmother    Heart disease Paternal Grandmother    Cancer Paternal Grandfather    Prostate cancer Paternal Grandfather        dx 31s   Prostate cancer Other        mets? d. 50     Social History   Social History   Tobacco Use   Smoking status: Never    Passive exposure: Never   Smokeless tobacco: Never  Vaping Use   Vaping status: Never Used  Substance Use Topics   Alcohol use: Yes    Alcohol/week: 1.0 standard drink of alcohol    Types: 1 Standard drinks or equivalent per week   Drug use: No   Jeanette Rice reports that she has never smoked. She has never been exposed to tobacco smoke. She has never used smokeless tobacco. She reports current alcohol use of about 1.0 standard drink of alcohol per week. She reports that she does not use drugs.  Vital Signs and Physical Examination   Vitals:   07/15/24 0854  BP: 118/72  Pulse: 66   Body mass index is 24.09 kg/m. Weight: 136 lb (61.7 kg)  General: Well developed, well nourished, no acute distress Head: Normocephalic and atraumatic Eyes: Sclerae anicteric, EOMI Lungs: Clear throughout to auscultation Heart: Regular rate and rhythm; No murmurs, rubs or bruits Abdomen: Soft, non tender and non distended. No masses, hepatosplenomegaly or hernias noted. Normal Bowel sounds Rectal: Deferred Musculoskeletal: Symmetrical with no gross deformities    Review of Data  The following data was reviewed at the time of this encounter:  Laboratory Studies      Latest Ref Rng & Units 10/14/2023    9:20 AM 05/14/2023    1:51 PM 10/10/2022    4:10 PM  CBC  WBC 4.0 - 10.5 K/uL 5.3  5.8  7.0   Hemoglobin 12.0 - 15.0 g/dL 85.6  86.5  86.1   Hematocrit 36.0 - 46.0 % 43.4  39.9  41.1   Platelets 150.0 - 400.0 K/uL 244.0  237  246.0     No results found  for: LIPASE    Latest Ref Rng & Units 10/14/2023    9:20 AM 05/14/2023    1:51 PM 10/09/2022   10:39 AM  CMP  Glucose 70 - 99 mg/dL 78  87  92   BUN 6 - 23 mg/dL 13  11  11    Creatinine 0.40 - 1.20 mg/dL 9.17  9.22  9.25   Sodium 135 - 145 mEq/L 137  139  137   Potassium 3.5 - 5.1 mEq/L 3.9  3.8  4.0   Chloride 96 - 112 mEq/L 103  105  104   CO2 19 - 32 mEq/L 27  22  28    Calcium 8.4 - 10.5 mg/dL 9.6  9.2  9.4   Total Protein 6.0 - 8.3 g/dL 7.7  7.3  7.5   Total Bilirubin 0.2 - 1.2 mg/dL 0.6  0.4  0.7   Alkaline Phos 39 - 117 U/L 54  49  47   AST 0 - 37 U/L 21  18  16    ALT 0 - 35 U/L 17  16  14       Imaging Studies  None  GI Procedures and Studies  None   Clinical Impression  It is my clinical impression that Jeanette Rice is a 38 y.o. 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  Jeanette Rice underwent genetic testing in 2022 based upon family history of breast cancer, prostate cancer, colon cancer.  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  We reviewed the current NCCN guidelines recommend that individuals with such a mutation begin colorectal cancer screening at the age of 50 and then every 5 years thereafter depending upon polyp burden.  As such, she is currently due to begin colorectal cancer screening.  Reviewed colonoscopy procedure, bowel preparation and risks and she is amenable to proceeding.  Jeanette Rice also has a longstanding history of chronic constipation that sounds to be slow transit in nature.  No features to suggest pelvic floor dysfunction.  We reviewed medical causes of chronic constipation including thyroid  disease and celiac disease.  She had a  normal TSH within the last year.  She has never been tested for celiac disease.  Noted that she may have a form of alopecia which could be autoimmune in nature.  I encouraged her to have celiac disease testing and she states she will broach this with her PCP in November.  Her constipation appears to be mild and seems to be readily treated with OTC agents.  No need for prescription laxatives at this time.  Plan  Schedule screening colon[facility] at [facility] with 2-day bowel prep given history of constipation Recommend celiac disease testing with PCP in November: TTG IgA and total serum IgA Continue to use stool softeners and magnesium based laxatives. Discussed the use of low-dose daily MiraLAX 1/4-1/2 cap daily or every other day. Maintain adequate fiber and hydration in diet.  Planned Follow Up PRN ending colonoscopy result  The patient or caregiver verbalized understanding of the material covered, with no barriers to understanding. All questions were answered. Patient or caregiver is agreeable with the plan outlined above.    It was a pleasure to see Donte.  If you have any questions or concerns regarding this evaluation, do not hesitate to contact me.  Inocente Hausen, MD [facility]

## 2024-07-15 ENCOUNTER — Encounter: Payer: Self-pay | Admitting: Pediatrics

## 2024-07-15 ENCOUNTER — Ambulatory Visit: Admitting: Pediatrics

## 2024-07-15 VITALS — BP 118/72 | HR 66 | Ht 63.0 in | Wt 136.0 lb

## 2024-07-15 DIAGNOSIS — Z800 Family history of malignant neoplasm of digestive organs: Secondary | ICD-10-CM

## 2024-07-15 DIAGNOSIS — K5900 Constipation, unspecified: Secondary | ICD-10-CM

## 2024-07-15 DIAGNOSIS — Z1589 Genetic susceptibility to other disease: Secondary | ICD-10-CM | POA: Diagnosis not present

## 2024-07-15 DIAGNOSIS — K5909 Other constipation: Secondary | ICD-10-CM

## 2024-07-15 DIAGNOSIS — Z83719 Family history of colon polyps, unspecified: Secondary | ICD-10-CM

## 2024-07-15 DIAGNOSIS — Z1211 Encounter for screening for malignant neoplasm of colon: Secondary | ICD-10-CM

## 2024-07-15 MED ORDER — NA SULFATE-K SULFATE-MG SULF 17.5-3.13-1.6 GM/177ML PO SOLN: 1.0000 | Freq: Once | ORAL | 0 refills | Status: AC

## 2024-07-15 NOTE — Patient Instructions (Signed)
 You have been scheduled for a colonoscopy. Please follow written instructions given to you at your visit today.   If you use inhalers (even only as needed), please bring them with you on the day of your procedure.  DO NOT TAKE 7 DAYS PRIOR TO TEST- Trulicity (dulaglutide) Ozempic, Wegovy (semaglutide) Mounjaro (tirzepatide) Bydureon Bcise (exanatide extended release)  DO NOT TAKE 1 DAY PRIOR TO YOUR TEST Rybelsus (semaglutide) Adlyxin (lixisenatide) Victoza (liraglutide) Byetta (exanatide) ___________________________________________________________________________   Thank you for entrusting me with your care and for choosing [facility], Dr. Inocente Hausen  _______________________________________________________  If your blood pressure at your visit was 140/90 or greater, please contact your primary care physician to follow up on this.  _______________________________________________________  If you are age 14 or older, your body mass index should be between 23-30. Your Body mass index is 24.09 kg/m. If this is out of the aforementioned range listed, please consider follow up with your Primary Care Provider.  If you are age 42 or younger, your body mass index should be between 19-25. Your Body mass index is 24.09 kg/m. If this is out of the aformentioned range listed, please consider follow up with your Primary Care Provider.   ________________________________________________________  The [facility] providers would like to encourage you to use MYCHART to communicate with providers for non-urgent requests or questions.  Due to long hold times on the telephone, sending your provider a message by [facility] may be a faster and more efficient way to get a response.  Please allow 48 business hours for a response.  Please remember that this is for non-urgent requests.  _______________________________________________________  [facility] is using a team-based approach  to care.  Your team is made up of your doctor and two to three APPS. Our APPS (Nurse Practitioners and Physician Assistants) work with your physician to ensure care continuity for you. They are fully qualified to address your health concerns and develop a treatment plan. They communicate directly with your gastroenterologist to care for you. Seeing the Advanced Practice Practitioners on your physician's team can help you by facilitating care more promptly, often allowing for earlier appointments, access to diagnostic testing, procedures, and other specialty referrals.

## 2024-07-20 DIAGNOSIS — Z803 Family history of malignant neoplasm of breast: Secondary | ICD-10-CM | POA: Diagnosis not present

## 2024-07-20 DIAGNOSIS — Z1502 Genetic susceptibility to malignant neoplasm of ovary: Secondary | ICD-10-CM | POA: Diagnosis not present

## 2024-07-20 DIAGNOSIS — N644 Mastodynia: Secondary | ICD-10-CM | POA: Diagnosis not present

## 2024-07-20 DIAGNOSIS — R928 Other abnormal and inconclusive findings on diagnostic imaging of breast: Secondary | ICD-10-CM | POA: Diagnosis not present

## 2024-07-20 DIAGNOSIS — R92333 Mammographic heterogeneous density, bilateral breasts: Secondary | ICD-10-CM | POA: Diagnosis not present

## 2024-07-20 DIAGNOSIS — Z1509 Genetic susceptibility to other malignant neoplasm: Secondary | ICD-10-CM | POA: Diagnosis not present

## 2024-07-20 DIAGNOSIS — Z1589 Genetic susceptibility to other disease: Secondary | ICD-10-CM | POA: Diagnosis not present

## 2024-07-20 DIAGNOSIS — Z9189 Other specified personal risk factors, not elsewhere classified: Secondary | ICD-10-CM | POA: Diagnosis not present

## 2024-07-20 DIAGNOSIS — Z1231 Encounter for screening mammogram for malignant neoplasm of breast: Secondary | ICD-10-CM | POA: Diagnosis not present

## 2024-07-20 DIAGNOSIS — Z1501 Genetic susceptibility to malignant neoplasm of breast: Secondary | ICD-10-CM | POA: Diagnosis not present

## 2024-08-23 NOTE — Progress Notes (Unsigned)
 [facility] History and Physical   Primary Care Physician:  Wendolyn Jenkins Jansky, MD   Reason for Procedure:  Colorectal cancer screening and a high risk individual with CHEK2 mutation, family history of colon polyps-father, family history of colorectal cancer-maternal grandmother and chronic constipation  Plan:   Colonoscopy    HPI: Jeanette Rice is a 38 y.o. female undergoing screening colonoscopy.  She has a pertinent history of a CHEK2 mutation.  There is a family history of colon polyps in her father.  Her maternal grandmother had colorectal cancer.  She has also endorsed longstanding history of constipation that sound to be slow transit in nature.  Laboratory testing for thyroid  disease and celiac disease has been unremarkable.    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 Authorizing Provider  clonazePAM  (KLONOPIN ) 0.5 MG tablet Take 1 tablet (0.5 mg total) by mouth daily as needed for anxiety. 05/14/23   Wendolyn Jenkins Jansky, MD  cyclobenzaprine  (FLEXERIL ) 5 MG tablet Take 1 tablet (5 mg total) by mouth 3 (three) times daily as needed for muscle spasms. 10/27/23   Wendolyn Jenkins Jansky, MD  ivermectin (STROMECTOL) 3 MG TABS tablet Take by mouth as needed. 12/27/21   [provider]  LO LOESTRIN FE 1 MG-10 MCG / 10 MCG tablet Take 1 tablet by mouth daily. 02/28/22   [provider]  MAGNESIUM PO Take by mouth. Patient not taking: Reported on 07/15/2024    [provider]  metoprolol  tartrate  (LOPRESSOR ) 25 MG tablet May take 25 mg  tablet 2 to 3 times a day  up to 8 hours apart if needed for palp Patient not taking: Reported on 07/15/2024 12/22/23   Anner Alm ORN, MD  oseltamivir  (TAMIFLU ) 75 MG capsule Take 1 capsule (75 mg total) by mouth daily. Take now for prevention Patient not taking: Reported on 07/15/2024 12/29/23   Wendolyn Jenkins Jansky, MD  tirzepatide (MOUNJARO) 5 MG/0.5ML Pen Inject 2.5 mg into the skin once a week. Patient taking differently: Inject 2.5 mg into the skin once a week. Pt take between 2-3 weeks    [provider]    Current Outpatient Medications  Medication Sig Dispense Refill   clonazePAM  (KLONOPIN ) 0.5 MG tablet Take 1 tablet (0.5 mg total) by mouth daily as needed for anxiety. 10 tablet 0   cyclobenzaprine  (FLEXERIL ) 5 MG tablet Take 1 tablet (5 mg total) by mouth 3 (three) times daily as needed for muscle spasms. 15 tablet 0   ivermectin (STROMECTOL) 3 MG TABS tablet Take by mouth as needed.     LO LOESTRIN FE 1 MG-10 MCG / 10 MCG tablet Take 1 tablet by mouth daily.     MAGNESIUM PO Take by mouth. (Patient not taking: Reported on 07/15/2024)     metoprolol  tartrate (LOPRESSOR ) 25 MG tablet May take 25 mg  tablet 2 to 3 times a day  up to 8 hours apart if needed for palp (Patient not taking: Reported  on 07/15/2024) 20 tablet 3   oseltamivir  (TAMIFLU ) 75 MG capsule Take 1 capsule (75 mg total) by mouth daily. Take now for prevention (Patient not taking: Reported on 07/15/2024) 10 capsule 0   tirzepatide (MOUNJARO) 5 MG/0.5ML Pen Inject 2.5 mg into the skin once a week. (Patient taking differently: Inject 2.5 mg into the skin once a week. Pt take between 2-3 weeks)     No current facility-administered medications for this visit.    Allergies as of 08/24/2024   (No Known Allergies)    Family History  Problem Relation Age of Onset   Cancer Mother    Breast cancer Mother        DCIS; dx 80s   Other Father        mutations in CHEK2 and APC genes    Depression Maternal Grandmother    Cancer Maternal Grandmother    Congestive Heart Failure Maternal Grandmother 82   Breast cancer Maternal Grandmother        dx 31s   Colon cancer Maternal Grandmother 75   Hypertension Maternal Grandfather    Heart disease Maternal Grandfather    Hypertension Paternal Grandmother    Heart disease Paternal Grandmother    Cancer Paternal Grandfather    Prostate cancer Paternal Grandfather        dx 14s   Prostate cancer Other        mets? d. 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   Lives at home with family.-Husband and 2 children ages 2 and 5.   She is a stay-at-home mom .   Leftt-handed.   1 cup caffeine per day.   Social Drivers of Corporate investment banker Strain: Low Risk  (05/14/2023)   Overall Financial Resource Strain (CARDIA)    Difficulty of Paying Living Expenses: Not hard at all  Food Insecurity: No Food Insecurity (05/14/2023)   Hunger Vital Sign    Worried About Running Out of Food in the Last Year: Never true    Ran Out of Food in the Last Year: Never true  Transportation Needs: No Transportation Needs (05/14/2023)   PRAPARE -  Administrator, Civil Service (Medical): No    Lack of Transportation (Non-Medical): No  Physical Activity: Insufficiently Active (05/14/2023)   Exercise Vital Sign    Days of Exercise per Week: 3 days    Minutes of Exercise per Session: 30 min  Stress: No Stress Concern Present (05/14/2023)   [employer] of Occupational Health - Occupational Stress Questionnaire  Feeling of Stress : Not at all  Social Connections: Moderately Isolated (05/14/2023)   Social Connection and Isolation Panel    Frequency of Communication with Friends and Family: More than three times a week    Frequency of Social Gatherings with Friends and Family: Once a week    Attends Religious Services: Never    Database administrator or Organizations: No    Attends Engineer, structural: Not on file    Marital Status: Married  Intimate Partner Violence: Unknown (02/28/2022)   Received from [facility]   HITS    Physically Hurt: Not on file    Insult or Talk Down To: Not on file    Threaten Physical Harm: Not on file    Scream or Curse: Not on file    Review of Systems:  All other review of systems negative except as mentioned in the HPI.  Physical Exam: Vital signs There were no vitals taken for this visit.  General:   Alert,  Well-developed, well-nourished, pleasant and cooperative in NAD Airway:  Mallampati  Lungs:  Clear throughout to auscultation.   Heart:  Regular rate and rhythm; no murmurs, clicks, rubs,  or gallops. Abdomen:  Soft, nontender and nondistended. Normal bowel sounds.   Neuro/Psych:  Normal mood and affect. A and O x 3  Inocente Hausen, MD [facility]

## 2024-08-24 ENCOUNTER — Encounter: Payer: Self-pay | Admitting: Pediatrics

## 2024-08-24 ENCOUNTER — Ambulatory Visit (AMBULATORY_SURGERY_CENTER): Admitting: Pediatrics

## 2024-08-24 VITALS — BP 112/65 | HR 93 | Temp 98.8°F | Resp 18 | Ht 63.0 in | Wt 136.0 lb

## 2024-08-24 DIAGNOSIS — Z1211 Encounter for screening for malignant neoplasm of colon: Secondary | ICD-10-CM | POA: Diagnosis not present

## 2024-08-24 DIAGNOSIS — K5730 Diverticulosis of large intestine without perforation or abscess without bleeding: Secondary | ICD-10-CM

## 2024-08-24 DIAGNOSIS — D123 Benign neoplasm of transverse colon: Secondary | ICD-10-CM | POA: Diagnosis not present

## 2024-08-24 DIAGNOSIS — D128 Benign neoplasm of rectum: Secondary | ICD-10-CM | POA: Diagnosis not present

## 2024-08-24 DIAGNOSIS — Z83719 Family history of colon polyps, unspecified: Secondary | ICD-10-CM

## 2024-08-24 DIAGNOSIS — Z1589 Genetic susceptibility to other disease: Secondary | ICD-10-CM

## 2024-08-24 DIAGNOSIS — K5909 Other constipation: Secondary | ICD-10-CM

## 2024-08-24 DIAGNOSIS — K5140 Inflammatory polyps of colon without complications: Secondary | ICD-10-CM | POA: Diagnosis not present

## 2024-08-24 MED ORDER — SODIUM CHLORIDE 0.9 % IV SOLN: 500.0000 mL | Freq: Once | INTRAVENOUS | Status: DC

## 2024-08-24 NOTE — Op Note (Signed)
 [facility] Patient Name: Jeanette Rice Procedure Date: 08/24/2024 11:17 AM MRN: [id number] Endoscopist: Inocente Hausen , MD, [id number] Age: 38 Referring MD:  Date of Birth: [birth year] Gender: Female Account #: [id number] Procedure:                Colonoscopy Indications:              Colon cancer screening in patient at increased                            risk: Family history of 1st-degree relative with                            colon polyps (Father), Incidental constipation                            noted, Family history of colorectal cancer in                            maternal grandmother, personal history of CHEK2                            mutation Medicines:                Monitored Anesthesia Care Procedure:                Pre-Anesthesia Assessment:                           - Prior to the procedure, a History and Physical                            was performed, and patient medications and                            allergies were reviewed. The patient's tolerance of                            previous anesthesia was also reviewed. The risks                            and benefits of the procedure and the sedation                            options and risks were discussed with the patient.                            All questions were answered, and informed consent                            was obtained. Prior Anticoagulants: The patient has                            taken no anticoagulant or antiplatelet agents. ASA  Grade Assessment: II - A patient with mild systemic                            disease. After reviewing the risks and benefits,                            the patient was deemed in satisfactory condition to                            undergo the procedure.                           After obtaining informed consent, the colonoscope                            was passed under direct vision. Throughout the                             procedure, the patient's blood pressure, pulse, and                            oxygen saturations were monitored continuously. The                            CF [id number] #[id number] was introduced through the anus                            and advanced to the cecum, identified by                            appendiceal orifice and ileocecal valve. The                            colonoscopy was performed without difficulty. The                            patient tolerated the procedure well. The quality                            of the bowel preparation was good. The ileocecal                            valve, appendiceal orifice, and rectum were                            photographed. Scope In: 11:23:58 AM Scope Out: 11:41:39 AM Scope Withdrawal Time: 0 hours 11 minutes 50 seconds  Total Procedure Duration: 0 hours 17 minutes 41 seconds  Findings:                 The perianal and digital rectal examinations were                            normal. Pertinent negatives include normal  sphincter tone and no palpable rectal lesions.                           Sigmoid colon was notable for restricted mobility.                           A few small-mouthed diverticula were found in the                            sigmoid colon.                           Two sessile polyps were found in the rectum and                            transverse colon. The polyps were 4 mm in size.                            These polyps were removed with a cold biopsy                            forceps. Resection and retrieval were complete.                           The retroflexed view of the distal rectum and anal                            verge was normal and showed no anal or rectal                            abnormalities. Complications:            No immediate complications. Estimated blood loss:                            Minimal. Estimated Blood Loss:     Estimated blood loss  was minimal. Impression:               - Diverticulosis in the sigmoid colon.                           - Two 4 mm polyps in the rectum and in the                            transverse colon, removed with a cold biopsy                            forceps. Resected and retrieved. Recommendation:           - Discharge patient to home (ambulatory).                           - Await pathology results.                           - Repeat colonoscopy for  surveillance based on                            pathology results. Consider use of pediatric                            colonoscope for future procedures given restricted                            mobility of sigmoid colon.                           - Return to clinic in 2 to 3 months with Dr.                            Suzann or APP Oscar Coombs or [facility]) to                            follow up constipation.                           - The findings and recommendations were discussed                            with the patient's family.                           - Patient has a contact number available for                            emergencies. The signs and symptoms of potential                            delayed complications were discussed with the                            patient. Return to normal activities tomorrow.                            Written discharge instructions were provided to the                            patient. Inocente Suzann, MD 08/24/2024 11:48:48 AM This report has been signed electronically.

## 2024-08-24 NOTE — Patient Instructions (Addendum)
 Await pathology results Repeat colonoscopy for surveillance based on pathology results  Return to clinic in 2-3 months to follow up constipation (11/20 at 10:30AM) See handouts for diverticulosis and polyps  YOU HAD AN ENDOSCOPIC PROCEDURE TODAY AT THE [facility]:   Refer to the procedure report that was given to you for any specific questions about what was found during the examination.  If the procedure report does not answer your questions, please call your gastroenterologist to clarify.  If you requested that your care partner not be given the details of your procedure findings, then the procedure report has been included in a sealed envelope for you to review at your convenience later.  YOU SHOULD EXPECT: Some feelings of bloating in the abdomen. Passage of more gas than usual.  Walking can help get rid of the air that was put into your GI tract during the procedure and reduce the bloating. If you had a lower endoscopy (such as a colonoscopy or flexible sigmoidoscopy) you may notice spotting of blood in your stool or on the toilet paper. If you underwent a bowel prep for your procedure, you may not have a normal bowel movement for a few days.  Please Note:  You might notice some irritation and congestion in your nose or some drainage.  This is from the oxygen used during your procedure.  There is no need for concern and it should clear up in a day or so.  SYMPTOMS TO REPORT IMMEDIATELY: Following lower endoscopy (colonoscopy or flexible sigmoidoscopy):  Excessive amounts of blood in the stool  Significant tenderness or worsening of abdominal pains  Swelling of the abdomen that is new, acute  Fever of 100F or higher For urgent or emergent issues, a gastroenterologist can be reached at any hour by calling [phone]. Do not use MyChart messaging for urgent concerns.   DIET:  We do recommend a small meal at first, but then you may proceed to your regular diet.  Drink plenty of  fluids but you should avoid alcoholic beverages for 24 hours.  ACTIVITY:  You should plan to take it easy for the rest of today and you should NOT DRIVE or use heavy machinery until tomorrow (because of the sedation medicines used during the test).    FOLLOW UP: Our staff will call the number listed on your records the next business day following your procedure.  We will call around 7:15- 8:00 am to check on you and address any questions or concerns that you may have regarding the information given to you following your procedure. If we do not reach you, we will leave a message.     If any biopsies were taken you will be contacted by phone or by letter within the next 1-3 weeks.  Please call us  at [phone] if you have not heard about the biopsies in 3 weeks.   SIGNATURES/CONFIDENTIALITY: You and/or your care partner have signed paperwork which will be entered into your electronic medical record.  These signatures attest to the fact that that the information above on your After Visit Summary has been reviewed and is understood.  Full responsibility of the confidentiality of this discharge information lies with you and/or your care-partner.

## 2024-08-24 NOTE — Progress Notes (Signed)
 Pt's states no medical or surgical changes since previsit or office visit.

## 2024-08-24 NOTE — Progress Notes (Signed)
 To pacu, VSS. Report to [email or web address]

## 2024-08-25 ENCOUNTER — Telehealth: Payer: Self-pay | Admitting: Lactation Services

## 2024-08-25 NOTE — Telephone Encounter (Signed)
 Patient answered, however, she was using her watch and the connection was discontinued.  Attempted to return call; no answer

## 2024-08-27 LAB — SURGICAL PATHOLOGY

## 2024-08-30 ENCOUNTER — Ambulatory Visit: Payer: Self-pay | Admitting: Pediatrics

## 2024-09-10 ENCOUNTER — Telehealth: Payer: Self-pay | Admitting: Pediatrics

## 2024-09-10 NOTE — Telephone Encounter (Signed)
 Left a message for pt to let her know the $750 bill she received from the colonoscopy is her part of the insurance to pay with [employer]. She has a $750 co-pay- no deductible.

## 2024-09-27 ENCOUNTER — Encounter: Payer: Self-pay | Admitting: Radiology

## 2024-10-13 DIAGNOSIS — H5711 Ocular pain, right eye: Secondary | ICD-10-CM | POA: Diagnosis not present

## 2024-10-13 DIAGNOSIS — S0501XA Injury of conjunctiva and corneal abrasion without foreign body, right eye, initial encounter: Secondary | ICD-10-CM | POA: Diagnosis not present

## 2024-10-14 ENCOUNTER — Ambulatory Visit: Payer: 59 | Admitting: Family Medicine

## 2024-10-14 ENCOUNTER — Ambulatory Visit: Admitting: Gastroenterology

## 2024-10-14 ENCOUNTER — Encounter: Payer: Self-pay | Admitting: Family Medicine

## 2024-10-14 ENCOUNTER — Ambulatory Visit: Payer: Self-pay | Admitting: Family Medicine

## 2024-10-14 VITALS — BP 112/74 | HR 88 | Temp 97.4°F | Ht 63.0 in | Wt 141.0 lb

## 2024-10-14 DIAGNOSIS — R7689 Other specified abnormal immunological findings in serum: Secondary | ICD-10-CM | POA: Diagnosis not present

## 2024-10-14 DIAGNOSIS — Z6824 Body mass index (BMI) 24.0-24.9, adult: Secondary | ICD-10-CM

## 2024-10-14 DIAGNOSIS — Z0000 Encounter for general adult medical examination without abnormal findings: Secondary | ICD-10-CM | POA: Diagnosis not present

## 2024-10-14 DIAGNOSIS — Z131 Encounter for screening for diabetes mellitus: Secondary | ICD-10-CM | POA: Diagnosis not present

## 2024-10-14 DIAGNOSIS — E7800 Pure hypercholesterolemia, unspecified: Secondary | ICD-10-CM

## 2024-10-14 LAB — COMPREHENSIVE METABOLIC PANEL WITH GFR
ALT: 13 U/L (ref 0–35)
AST: 17 U/L (ref 0–37)
Albumin: 4.6 g/dL (ref 3.5–5.2)
Alkaline Phosphatase: 50 U/L (ref 39–117)
BUN: 13 mg/dL (ref 6–23)
CO2: 26 meq/L (ref 19–32)
Calcium: 9.2 mg/dL (ref 8.4–10.5)
Chloride: 102 meq/L (ref 96–112)
Creatinine, Ser: 0.73 mg/dL (ref 0.40–1.20)
GFR: 102.62 mL/min (ref >60.00)
Glucose, Bld: 80 mg/dL (ref 70–99)
Potassium: 4 meq/L (ref 3.5–5.1)
Sodium: 136 meq/L (ref 135–145)
Total Bilirubin: 0.7 mg/dL (ref 0.2–1.2)
Total Protein: 7.5 g/dL (ref 6.0–8.3)

## 2024-10-14 LAB — CBC WITH DIFFERENTIAL/PLATELET
Basophils Absolute: 0 K/uL (ref 0.0–0.1)
Basophils Relative: 0.4 % (ref 0.0–3.0)
Eosinophils Absolute: 0.1 K/uL (ref 0.0–0.7)
Eosinophils Relative: 0.7 % (ref 0.0–5.0)
HCT: 40.2 % (ref 36.0–46.0)
Hemoglobin: 13.6 g/dL (ref 12.0–15.0)
Lymphocytes Relative: 10.7 % — ABNORMAL LOW (ref 12.0–46.0)
Lymphs Abs: 0.8 K/uL (ref 0.7–4.0)
MCHC: 33.7 g/dL (ref 30.0–36.0)
MCV: 96.1 fl (ref 78.0–100.0)
Monocytes Absolute: 0.3 K/uL (ref 0.1–1.0)
Monocytes Relative: 3.8 % (ref 3.0–12.0)
Neutro Abs: 6 K/uL (ref 1.4–7.7)
Neutrophils Relative %: 84.4 % — ABNORMAL HIGH (ref 43.0–77.0)
Platelets: 233 K/uL (ref 150.0–400.0)
RBC: 4.19 Mil/uL (ref 3.87–5.11)
RDW: 12.4 % (ref 11.5–15.5)
WBC: 7.1 K/uL (ref 4.0–10.5)

## 2024-10-14 LAB — CORTISOL: Cortisol, Plasma: 14.4 ug/dL

## 2024-10-14 LAB — C-REACTIVE PROTEIN: CRP: <0.5 mg/dL (ref 0.5–20.0)

## 2024-10-14 LAB — IBC + FERRITIN
Ferritin: 59.9 ng/mL (ref 10.0–291.0)
Iron: 191 ug/dL — ABNORMAL HIGH (ref 42–145)
Saturation Ratios: 53.5 % — ABNORMAL HIGH (ref 20.0–50.0)
TIBC: 357 ug/dL (ref 250.0–450.0)
Transferrin: 255 mg/dL (ref 212.0–360.0)

## 2024-10-14 LAB — LIPID PANEL
Cholesterol: 193 mg/dL (ref 0–200)
HDL: 62.8 mg/dL (ref >39.00)
LDL Cholesterol: 116 mg/dL — ABNORMAL HIGH (ref 0–99)
NonHDL: 129.71
Total CHOL/HDL Ratio: 3
Triglycerides: 71 mg/dL (ref 0.0–149.0)
VLDL: 14.2 mg/dL (ref 0.0–40.0)

## 2024-10-14 LAB — HEMOGLOBIN A1C: Hgb A1c MFr Bld: 4.8 % (ref 4.6–6.5)

## 2024-10-14 LAB — TSH: TSH: 1.26 u[IU]/mL (ref 0.35–5.50)

## 2024-10-14 NOTE — Progress Notes (Signed)
 Labs great except Iron levels are high again-does she take the placebo week of the Lo-estrin?  If yes, then stop as that has iron in it(and if just taking it now, may explain why high).

## 2024-10-14 NOTE — Progress Notes (Signed)
 Phone [phone]   Subjective:   Patient is a 38 y.o. female presenting for annual physical.    Chief Complaint  Patient presents with   Annual Exam    Pt is here for CPE    Discussed the use of AI scribe software for clinical note transcription with the patient, who gave verbal consent to proceed.  History of Present Illness Jeanette Rice is a 38 year old female who presents for annual physical and blood work monitoring.  She has a history of palpitations and experienced a single episode of tachycardia with a heart rate exceeding 200 bpm. She uses metoprolol  as needed for these episodes. She notes that Mounjaro may exacerbate her heart rate, but she continues its use intermittently to manage her weight.  She experiences frequent sore throats, humorously attributing them to her husband, but denies significant respiratory symptoms such as chest pain, coughing, or shortness of breath. She occasionally feels short of breath when running or climbing stairs, but not during regular activities-has had w/u.  She has a history of high iron levels, which have normalized, but she remains vigilant about monitoring her iron status due to the absence of menstruation. She also has a history of high cholesterol and is interested in checking her lipoprotein levels.  She experiences occasional headaches but does not describe them as severe or concerning. No significant neurological symptoms such as dizziness, blurry vision, or double vision, though she notes occasional eye twitching.  She is interested in monitoring her ANA levels due to previous positive results and a history of autoimmune concerns. She has had issues with previous rheumatology consultations and is keen on ensuring accurate testing.  Her current medications include birth control pills, metoprolol  as needed, and intermittent use of Mounjaro. She also takes magnesium supplements and occasionally vitamin D  and calcium.  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 She engages in physical activity by going to the gym and taking walks on non-working days.    See problem oriented charting- ROS- ROS: Gen: no fever, chills  Skin: no rash, itching ENT: no ear pain, ear drainage, nasal congestion, rhinorrhea, sinus pressure.  Gets sore throats a lot Eyes: no blurry vision, double vision Resp: no cough, wheeze,.  Some DOE-chronic.  Has had w/u  CV: no CP, palpitations, LE edema,  GI: no heartburn, n/v/d/c, abd pain GU: no dysuria, urgency, frequency, hematuria MSK: no joint pain, myalgias, back pain Neuro: no dizziness, weakness, vertigo.  Some ha Psych: no depression, anxiety, insomnia, SI   The following were reviewed and entered/updated in epic: Past Medical History:  Diagnosis Date   Allergy    COVID-19 long hauler manifesting chronic fatigue 08/20/2021   COVID-19 virus infection    July 2020   Family history of breast cancer 04/20/2021   Family history of colon cancer 04/20/2021   Family history of gene mutation in CHEK2 and APC 04/20/2021   Family history of prostate cancer 04/20/2021   Patient Active Problem List   Diagnosis Date Noted   Positive ANA (antinuclear antibody) 07/18/2023   Chronic low back pain 03/31/2023   Genetic disease 10/08/2021   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   Genetic testing 05/14/2021   Family history of breast cancer 04/20/2021   Family history of prostate cancer 04/20/2021   Family history of colon cancer 04/20/2021   Family history of gene mutation in CHEK2 and APC 04/20/2021   Post covid-19 condition, unspecified 12/14/2020  Premature atrial contractions 07/20/2020   Shortness of breath 07/20/2020   Arrhythmia, atrial 07/20/2020   History of COVID-19 05/03/2020   Fatigue 05/03/2020   Muscle pain 05/03/2020   Arthralgia of right ankle 05/03/2020   Physical deconditioning 05/03/2020   Paresthesia  12/14/2019   Pregnancy 06/25/2017   Past Surgical History:  Procedure Laterality Date   WISDOM TOOTH EXTRACTION      Family History  Problem Relation Age of Onset   Cancer Mother    Breast cancer Mother        DCIS; dx 64s   Other Father        mutations in CHEK2 and APC genes   Depression Maternal Grandmother    Cancer Maternal Grandmother    Congestive Heart Failure Maternal Grandmother 66   Breast cancer Maternal Grandmother        dx 80s   Colon cancer Maternal Grandmother 75   Hypertension Maternal Grandfather    Heart disease Maternal Grandfather    Hypertension Paternal Grandmother    Heart disease Paternal Grandmother    Cancer Paternal Grandfather    Prostate cancer Paternal Grandfather        dx 33s   Prostate cancer Other        mets? d. 73    Medications- reviewed and updated Current Outpatient Medications  Medication Sig Dispense Refill   clonazePAM  (KLONOPIN ) 0.5 MG tablet Take 1 tablet (0.5 mg total) by mouth daily as needed for anxiety. 10 tablet 0   ivermectin (STROMECTOL) 3 MG TABS tablet Take by mouth as needed.     LO LOESTRIN FE 1 MG-10 MCG / 10 MCG tablet Take 1 tablet by mouth daily.     MAGNESIUM PO Take by mouth.     metoprolol  tartrate (LOPRESSOR ) 25 MG tablet May take 25 mg  tablet 2 to 3 times a day  up to 8 hours apart if needed for palp 20 tablet 3   tirzepatide (MOUNJARO) 5 MG/0.5ML Pen Inject 2.5 mg into the skin once a week.     No current facility-administered medications for this visit.    Allergies-reviewed and updated No Known Allergies  Social History   Social History Narrative   She is originally from the [place] area and moved to [place] for her husband's job.   Lives at home with family.-Husband and 2 children ages 2 and 5.   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   1 cup caffeine per day.   Objective  Objective:  BP 112/74 (BP Location: Left Arm, Patient Position: Sitting, Cuff Size: Normal)    Pulse 88   Temp (!) 97.4 F (36.3 C) (Temporal)   Ht 5' 3 (1.6 m)   Wt 141 lb (64 kg)   BMI 24.98 kg/m  Physical Exam  Gen: WDWN NAD HEENT: NCAT, conjunctiva not injected, sclera nonicteric TM WNL B, OP moist, no exudates  NECK:  supple, no thyromegaly, no nodes, no carotid bruits CARDIAC: RRR, S1S2+, no murmur. DP 2+B LUNGS: CTAB. No wheezes ABDOMEN:  BS+, soft, NTND, No HSM, no masses EXT:  no edema MSK: no gross abnormalities. MS 5/5 all 4 NEURO: A&O x3.  CN II-XII intact.  PSYCH: normal mood. Good eye contact     Assessment and Plan   Health Maintenance counseling: 1. Anticipatory guidance: Patient counseled regarding regular dental exams q6 months, eye exams,  avoiding smoking and second hand smoke, limiting alcohol to 1 beverage per day, no illicit  drugs.   2. Risk factor reduction:  Advised patient of need for regular exercise and diet rich and fruits and vegetables to reduce risk of heart attack and stroke. Exercise- +.  Wt Readings from Last 3 Encounters:  10/14/24 141 lb (64 kg)  08/24/24 136 lb (61.7 kg)  07/15/24 136 lb (61.7 kg)   3. Immunizations/screenings/ancillary studies Immunization History  Administered Date(s) Administered   Influenza Inj Mdck Quad Pf 08/31/2018   Influenza, Seasonal, Injecte, Preservative Fre 09/12/2016   Influenza,inj,Quad PF,6+ Mos 09/12/2016, 09/02/2017   Influenza,inj,quad, With Preservative 08/31/2018   Influenza-Unspecified 09/12/2016, 09/02/2017, 08/31/2018   Janssen (J&J) SARS-COV-2 Vaccination 03/04/2020   PFIZER(Purple Top)SARS-COV-2 Vaccination 03/23/2020, 04/14/2020   Tdap 11/25/2014, 12/01/2017   There are no preventive care reminders to display for this patient.  4. Cervical cancer screening- utd 5. Breast cancer screening-  mammogram utd 6. Colon cancer screening - utd 7. Skin cancer screening- advised regular sunscreen use. Denies worrisome, changing, or new skin lesions.  8. Birth control/STD check- ocp 9.  Osteoporosis screening- n/a 10. Smoking associated screening - non smoker  Wellness examination -     Lipid panel -     Comprehensive metabolic panel with GFR -     CBC with Differential/Platelet -     Hemoglobin A1c -     TSH -     ANA, IFA Comprehensive Panel -     IBC + Ferritin -     C-reactive protein -     Cortisol -     Anti-TPO Ab (RDL) -     Cyclic citrul peptide antibody, IgG  Positive ANA (antinuclear antibody) -     ANA, IFA Comprehensive Panel -     C-reactive protein -     Cortisol -     Anti-TPO Ab (RDL) -     Cyclic citrul peptide antibody, IgG    Assessment and Plan Assessment & Plan Adult Wellness Visit   During her routine adult wellness visit, she reported no new surgeries or significant changes in health status. She continues taking birth control pills, exercises regularly, and maintains a healthy lifestyle.  Continue current medications and lifestyle. Encourage regular exercise and a healthy diet. Recommend taking a multivitamin and magnesium supplement.  Palpitations   She experiences intermittent palpitations, possibly exacerbated by Mounjaro, with one significant episode where her heart rate exceeded 200 bpm, managed with metoprolol . There have been no recent episodes of concern. Possible dehydration may contribute to elevated heart rate. Continue metoprolol  as needed for palpitations and ensure adequate hydration.  Abnormal immunological findings in serum   Previous abnormal ANA findings are being monitored to assess for potential lupus or other autoimmune conditions. A previous rheumatology consultation was unsatisfactory due to lab issues. Ordered ANA with reflexive positive (anti-nuclear IFA antibodies) panel and continue monitoring ANA levels annually.  Iron metabolism monitoring   She has a history of iron metabolism issues with elevated iron levels and no current menstruation, which may affect iron levels. Ordered CBC, comprehensive metabolic  panel, hemoglobin, IBC, and ferritin to monitor iron levels.  Hypercholesterolemia   She has a history of high cholesterol levels with no prior LPA testing conducted. Ordered lipid panel and LPA to assess cholesterol levels.     Recommended follow up: Return in about 1 year (around 10/14/2025) for annual physical.  Lab/Order associations:+ fasting  Jenkins CHRISTELLA Carrel, MD

## 2024-10-14 NOTE — Patient Instructions (Signed)

## 2024-10-15 DIAGNOSIS — S0501XD Injury of conjunctiva and corneal abrasion without foreign body, right eye, subsequent encounter: Secondary | ICD-10-CM | POA: Diagnosis not present

## 2024-10-15 NOTE — Telephone Encounter (Signed)
 Patient notified of Dr. Enos message via MyChart. Requested a response via telephone or MyChart to ensure how to proceed.

## 2024-10-16 NOTE — Progress Notes (Signed)
 Ccp negative.  Will repeat iron studies in 2 months-please place order and sch lab appt

## 2024-10-17 LAB — ANA, IFA COMPREHENSIVE PANEL
SM/RNP: <1 AI
SSA (Ro) (ENA) Antibody, IgG: <1 AI
SSB (La) (ENA) Antibody, IgG: <1 AI
SSB (La) (ENA) Antibody, IgG: <1 AI
Scleroderma (Scl-70) (ENA) Antibody, IgG: <1 AI
ds DNA Ab: 4 [IU]/mL — AB
ds DNA Ab: <4 [IU]/mL

## 2024-10-17 LAB — ANTI-NUCLEAR AB-TITER (ANA TITER)
ANA TITER: 1:40 {titer} — ABNORMAL HIGH
ANA Titer 1: 1:160 {titer} — ABNORMAL HIGH

## 2024-10-17 LAB — CYCLIC CITRUL PEPTIDE ANTIBODY, IGG: Cyclic Citrullin Peptide Ab: <16 U

## 2024-10-20 LAB — ANTI-TPO AB (RDL): Anti-TPO Ab (RDL): <9 [IU]/mL (ref <9.0)

## 2024-10-28 DIAGNOSIS — Z1272 Encounter for screening for malignant neoplasm of vagina: Secondary | ICD-10-CM | POA: Diagnosis not present

## 2024-10-28 DIAGNOSIS — Z0142 Encounter for cervical smear to confirm findings of recent normal smear following initial abnormal smear: Secondary | ICD-10-CM | POA: Diagnosis not present

## 2024-10-28 DIAGNOSIS — Z3040 Encounter for surveillance of contraceptives, unspecified: Secondary | ICD-10-CM | POA: Diagnosis not present

## 2024-10-28 DIAGNOSIS — Z800 Family history of malignant neoplasm of digestive organs: Secondary | ICD-10-CM | POA: Diagnosis not present

## 2024-10-28 DIAGNOSIS — Z803 Family history of malignant neoplasm of breast: Secondary | ICD-10-CM | POA: Diagnosis not present

## 2024-10-28 DIAGNOSIS — Q999 Chromosomal abnormality, unspecified: Secondary | ICD-10-CM | POA: Diagnosis not present

## 2024-10-28 DIAGNOSIS — Z01419 Encounter for gynecological examination (general) (routine) without abnormal findings: Secondary | ICD-10-CM | POA: Diagnosis not present

## 2024-10-28 DIAGNOSIS — Z1151 Encounter for screening for human papillomavirus (HPV): Secondary | ICD-10-CM | POA: Diagnosis not present

## 2024-10-28 DIAGNOSIS — Z8041 Family history of malignant neoplasm of ovary: Secondary | ICD-10-CM | POA: Diagnosis not present

## 2024-10-28 DIAGNOSIS — Z130 Encounter for screening for diseases of the blood and blood-forming organs and certain disorders involving the immune mechanism: Secondary | ICD-10-CM | POA: Diagnosis not present

## 2024-10-28 DIAGNOSIS — Z1389 Encounter for screening for other disorder: Secondary | ICD-10-CM | POA: Diagnosis not present

## 2024-10-28 DIAGNOSIS — Z124 Encounter for screening for malignant neoplasm of cervix: Secondary | ICD-10-CM | POA: Diagnosis not present

## 2024-10-31 ENCOUNTER — Encounter: Payer: Self-pay | Admitting: Family Medicine

## 2024-10-31 NOTE — Progress Notes (Signed)
 Please do order for iron studies for 2 months and sch lab visit-dx-elevated iron ANA same as 2023

## 2024-11-01 NOTE — Progress Notes (Signed)
 Pt has read results smk

## 2024-11-08 ENCOUNTER — Encounter: Payer: Self-pay | Admitting: Cardiology

## 2024-11-08 ENCOUNTER — Ambulatory Visit: Admitting: Cardiology

## 2024-11-08 VITALS — BP 106/66 | HR 75 | Ht 64.0 in | Wt 138.0 lb

## 2024-11-08 DIAGNOSIS — I491 Atrial premature depolarization: Secondary | ICD-10-CM | POA: Diagnosis not present

## 2024-11-08 DIAGNOSIS — I498 Other specified cardiac arrhythmias: Secondary | ICD-10-CM | POA: Diagnosis not present

## 2024-11-08 NOTE — Progress Notes (Unsigned)
 Cardiology Office Note:  .   Date:  11/08/2024  ID:  Jeanette Rice, DOB 03-09-1986, MRN [id number] PCP: Wendolyn Jenkins Jansky, MD  [facility] Providers Cardiologist:  Alm Clay, MD { Click to update primary MD,subspecialty MD or APP then REFRESH:1}    No chief complaint on file.   Patient Profile: .     Jeanette Rice is a anxious 38 y.o. female  with a PMH notable for symptomatic palpitations (PACs and PVCs)& likely PSVT vs. Afib who presents here for early annual at the request of Wendolyn Jenkins Jansky, MD.     Jeanette Rice was last seen on December 22, 2023 as a 43-month follow-up.  Subjective  Discussed the use of AI scribe software for clinical note transcription with the patient, who gave verbal consent to proceed.  History of Present Illness Jeanette Rice is a 38 year old female who presents for a follow-up regarding a prior episode of tachycardia.  In February, she experienced a significant episode of tachycardia with a heart rate reaching 189 beats per minute. The episode lasted approximately one hour and resolved with metoprolol , which she had been prescribed for such incidents. She describes the sensation as both racing and irregular, with her device recording 11 PACs and 5 PVCs during the event. She has not experienced a similar episode since February and has not needed to take metoprolol  again.  She has been taking tirzepatide for the past six months for weight management, administered every three weeks. She notes that her husband orders the medication online. Her heart rate can increase to 130 beats per minute when climbing stairs, but it eventually calms down.  She has a history of occasional premature atrial contractions (PACs) and premature ventricular contractions (PVCs), with a notable episode on November 25th where she recorded 7 PVCs and PACs.  She has a family history of atrial fibrillation, as her father has experienced similar issues and has been  hospitalized for it. She is concerned about the potential for her episodes to be related to atrial fibrillation, as her device sometimes records her heart rhythm as such during episodes of high heart rate.   Cardiovascular ROS: {roscv:310661}  ROS:  Review of Systems - Negative except still notes some off & on palpitations = not like February    Objective   Active Medications[1]  => only used Metoprolol  1 x  Studies Reviewed: SABRA   EKG Interpretation Date/Time:  Monday November 08 2024 08:56:35 EST Ventricular Rate:  75 PR Interval:  138 QRS Duration:  80 QT Interval:  376 QTC Calculation: 419 R Axis:   91  Text Interpretation: Sinus rhythm with Premature atrial complexes Rightward axis When compared with ECG of 20-May-2023 10:13, Premature atrial complexes are now Present Confirmed by Clay Alm ([id number]) on 11/08/2024 9:02:43 AM    Prior Studies CPX 06/12/23: Exercise testing with gas exchange demonstrates low-normal functional capacity when compared to matched sedentary norms.  Attending: Low normal exercise capacity. No obvious cardiopulmonary abnormality. Suspect deconditioning as primary cause. Would suggest formal exercise program and re-test in 4-6 months if symptoms persist.    7-Day Zio 10/2023-11/2023: Frequent PACs (6.2%), 15 short Atrial Runs (9--15 beats)   ECHO 07/2020: Normal LV  (EF 60-65%) and RV function.  Mild Bi-Atrial Enlargement No valvular normalities.  Risk Assessment/Calculations:          Physical Exam:   VS:  BP 106/66   Pulse 75   Ht 5' 4 (1.626 m)  Wt 138 lb (62.6 kg)   SpO2 100%   BMI 23.69 kg/m    Wt Readings from Last 3 Encounters:  11/08/24 138 lb (62.6 kg)  10/14/24 141 lb (64 kg)  08/24/24 136 lb (61.7 kg)    Physical Exam    GEN: Well nourished, well developed in no acute distress; *** NECK: No JVD; No carotid bruits CARDIAC: Normal S1, S2; RRR, no murmurs, rubs, gallops RESPIRATORY:  Clear to auscultation without rales,  wheezing or rhonchi ; nonlabored, good air movement. ABDOMEN: Soft, non-tender, non-distended EXTREMITIES:  No edema; No deformity      ASSESSMENT AND PLAN: .    No problem-specific Assessment & Plan notes found for this encounter.     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 Significant episode in February resolved with metoprolol . Discussed vagal maneuvers, adenosine, hydration, electrolyte balance, and trigger avoidance. Explained potential progression to AFib and monitoring importance. No invasive procedures needed due to infrequency. - Continue metoprolol  as needed for acute episodes. - Carry metoprolol  for emergency use during travel. - Perform vagal maneuvers during episodes: vigorous coughing, blowing through a straw, drinking ice water. - Ensure adequate hydration and electrolyte balance. - Avoid stress, anxiety, caffeine, and alcohol. - Consider EKG during episodes for evaluation. - Schedule annual cardiology follow-up.  Recording duration: 24 minutes       {Are you ordering a CV Procedure (e.g. stress test, cath, DCCV, TEE, etc)?   Press F2        :[id number]}   Follow-Up: No follow-ups on file.  I spent *** minutes in the care of Jeanette Rice today including {CHL AMB CAR Time Based Billing Options STW (Optional):[phone]::documenting in the encounter.}      Signed, Alm MICAEL Clay, MD, MS Alm Clay, M.D., M.S. Interventional Cardiologist  [facility] Pager # [phone]          [1]  Current Meds  Medication Sig   clonazePAM  (KLONOPIN ) 0.5 MG tablet Take 1 tablet (0.5 mg total) by mouth daily as needed for anxiety.   ivermectin (STROMECTOL) 3 MG TABS tablet Take by mouth as needed.   LO LOESTRIN FE 1 MG-10 MCG / 10 MCG tablet Take 1 tablet by mouth daily.   MAGNESIUM PO Take by mouth.   tirzepatide (MOUNJARO) 5  MG/0.5ML Pen Inject 2.5 mg into the skin once a week.

## 2024-11-08 NOTE — Patient Instructions (Signed)
 Medication Instructions:  No changes   *If you need a refill on your cardiac medications before your next appointment, please call your pharmacy*   Lab Work: Not needed    Testing/Procedures:  Not  needed  Follow-Up: At [facility], you and your health needs are our priority.  As part of our continuing mission to provide you with exceptional heart care, we have created designated Provider Care Teams.  These Care Teams include your primary Cardiologist (physician) and Advanced Practice Providers (APPs -  Physician Assistants and Nurse Practitioners) who all work together to provide you with the care you need, when you need it.     Your next appointment:   12 month(s)  The format for your next appointment:   In Person  Provider:   Alm Clay, MD or Jon Hails, PA-C, Aline Door, PA-C, Damien Braver, NP, or Katlyn West, NP     Other Instructions

## 2024-11-12 ENCOUNTER — Encounter: Payer: Self-pay | Admitting: Cardiology

## 2024-11-12 NOTE — Assessment & Plan Note (Signed)
 Paroxysmal atrial arrhythmia with premature atrial contractions Intermittent episodes of paroxysmal atrial arrhythmia with PACs and PVCs. Significant episode in February resolved with metoprolol . Discussed vagal maneuvers, adenosine, hydration, electrolyte balance, and trigger avoidance. Explained potential progression to AFib and monitoring importance. No invasive procedures needed due to infrequency. - Continue metoprolol  as needed for acute episodes. - Carry metoprolol  for emergency use during travel. - Perform vagal maneuvers during episodes: vigorous coughing, blowing through a straw, drinking ice water. - Ensure adequate hydration and electrolyte balance. - Avoid stress, anxiety, caffeine, and alcohol. - Consider EKG during episodes for evaluation. - Schedule annual cardiology follow-up.

## 2025-10-25 ENCOUNTER — Encounter: Admitting: Family Medicine
